# Patient Record
Sex: Female | Born: 1980 | Race: Black or African American | Hispanic: No | Smoking: Former smoker
Health system: Southern US, Community
[De-identification: ages and names within clinical notes are randomized; demographics above are authoritative.]

## PROBLEM LIST (undated history)

## (undated) ENCOUNTER — Inpatient Hospital Stay (HOSPITAL_COMMUNITY): Payer: Self-pay

## (undated) ENCOUNTER — Inpatient Hospital Stay (HOSPITAL_COMMUNITY): Payer: Medicaid - Out of State | Admitting: Obstetrics and Gynecology

## (undated) DIAGNOSIS — I10 Essential (primary) hypertension: Secondary | ICD-10-CM

## (undated) DIAGNOSIS — D219 Benign neoplasm of connective and other soft tissue, unspecified: Secondary | ICD-10-CM

## (undated) DIAGNOSIS — D649 Anemia, unspecified: Secondary | ICD-10-CM

## (undated) DIAGNOSIS — N809 Endometriosis, unspecified: Secondary | ICD-10-CM

## (undated) HISTORY — PX: OTHER SURGICAL HISTORY: SHX169

---
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
## 1999-01-09 ENCOUNTER — Encounter (HOSPITAL_COMMUNITY): Admission: RE | Admit: 1999-01-09 | Discharge: 1999-02-15 | Payer: Self-pay | Admitting: *Deleted

## 1999-01-25 ENCOUNTER — Inpatient Hospital Stay (HOSPITAL_COMMUNITY): Admission: AD | Admit: 1999-01-25 | Discharge: 1999-01-25 | Payer: Self-pay | Admitting: *Deleted

## 1999-02-04 ENCOUNTER — Inpatient Hospital Stay (HOSPITAL_COMMUNITY): Admission: AD | Admit: 1999-02-04 | Discharge: 1999-02-04 | Payer: Self-pay | Admitting: Obstetrics

## 1999-02-13 ENCOUNTER — Inpatient Hospital Stay (HOSPITAL_COMMUNITY): Admission: AD | Admit: 1999-02-13 | Discharge: 1999-02-17 | Payer: Self-pay | Admitting: Obstetrics & Gynecology

## 1999-02-13 ENCOUNTER — Encounter (INDEPENDENT_AMBULATORY_CARE_PROVIDER_SITE_OTHER): Payer: Self-pay

## 1999-02-21 ENCOUNTER — Inpatient Hospital Stay (HOSPITAL_COMMUNITY): Admission: AD | Admit: 1999-02-21 | Discharge: 1999-02-21 | Payer: Self-pay | Admitting: Obstetrics & Gynecology

## 2000-02-06 ENCOUNTER — Inpatient Hospital Stay (HOSPITAL_COMMUNITY): Admission: EM | Admit: 2000-02-06 | Discharge: 2000-02-07 | Payer: Self-pay | Admitting: *Deleted

## 2000-05-04 ENCOUNTER — Inpatient Hospital Stay (HOSPITAL_COMMUNITY): Admission: AD | Admit: 2000-05-04 | Discharge: 2000-05-04 | Payer: Self-pay | Admitting: *Deleted

## 2000-08-22 ENCOUNTER — Inpatient Hospital Stay (HOSPITAL_COMMUNITY): Admission: AD | Admit: 2000-08-22 | Discharge: 2000-08-22 | Payer: Self-pay | Admitting: Obstetrics

## 2000-08-22 ENCOUNTER — Encounter: Payer: Self-pay | Admitting: Obstetrics

## 2000-11-19 ENCOUNTER — Inpatient Hospital Stay (HOSPITAL_COMMUNITY): Admission: AD | Admit: 2000-11-19 | Discharge: 2000-11-19 | Payer: Self-pay | Admitting: Obstetrics & Gynecology

## 2000-11-27 ENCOUNTER — Encounter (HOSPITAL_COMMUNITY): Admission: RE | Admit: 2000-11-27 | Discharge: 2000-12-21 | Payer: Self-pay | Admitting: *Deleted

## 2000-12-18 ENCOUNTER — Encounter: Payer: Self-pay | Admitting: *Deleted

## 2000-12-19 ENCOUNTER — Encounter (INDEPENDENT_AMBULATORY_CARE_PROVIDER_SITE_OTHER): Payer: Self-pay

## 2000-12-19 ENCOUNTER — Inpatient Hospital Stay (HOSPITAL_COMMUNITY): Admission: AD | Admit: 2000-12-19 | Discharge: 2000-12-24 | Payer: Self-pay | Admitting: Obstetrics

## 2000-12-20 ENCOUNTER — Encounter: Payer: Self-pay | Admitting: Obstetrics

## 2000-12-26 ENCOUNTER — Inpatient Hospital Stay (HOSPITAL_COMMUNITY): Admission: AD | Admit: 2000-12-26 | Discharge: 2000-12-26 | Payer: Self-pay | Admitting: Obstetrics

## 2001-07-11 ENCOUNTER — Inpatient Hospital Stay (HOSPITAL_COMMUNITY): Admission: AD | Admit: 2001-07-11 | Discharge: 2001-07-11 | Payer: Self-pay | Admitting: Obstetrics & Gynecology

## 2001-08-12 ENCOUNTER — Encounter: Payer: Self-pay | Admitting: Emergency Medicine

## 2001-08-12 ENCOUNTER — Emergency Department (HOSPITAL_COMMUNITY): Admission: EM | Admit: 2001-08-12 | Discharge: 2001-08-12 | Payer: Self-pay | Admitting: Emergency Medicine

## 2001-12-16 ENCOUNTER — Inpatient Hospital Stay (HOSPITAL_COMMUNITY): Admission: AD | Admit: 2001-12-16 | Discharge: 2001-12-16 | Payer: Self-pay | Admitting: *Deleted

## 2001-12-18 ENCOUNTER — Emergency Department (HOSPITAL_COMMUNITY): Admission: EM | Admit: 2001-12-18 | Discharge: 2001-12-18 | Payer: Self-pay | Admitting: Emergency Medicine

## 2001-12-18 ENCOUNTER — Encounter: Payer: Self-pay | Admitting: Emergency Medicine

## 2002-05-07 ENCOUNTER — Inpatient Hospital Stay (HOSPITAL_COMMUNITY): Admission: AD | Admit: 2002-05-07 | Discharge: 2002-05-07 | Payer: Self-pay | Admitting: Obstetrics and Gynecology

## 2002-08-20 ENCOUNTER — Inpatient Hospital Stay (HOSPITAL_COMMUNITY): Admission: AD | Admit: 2002-08-20 | Discharge: 2002-08-20 | Payer: Self-pay | Admitting: Obstetrics and Gynecology

## 2002-08-20 ENCOUNTER — Encounter: Payer: Self-pay | Admitting: Obstetrics and Gynecology

## 2003-03-16 ENCOUNTER — Inpatient Hospital Stay (HOSPITAL_COMMUNITY): Admission: AD | Admit: 2003-03-16 | Discharge: 2003-03-16 | Payer: Self-pay | Admitting: Obstetrics & Gynecology

## 2003-03-16 ENCOUNTER — Encounter: Payer: Self-pay | Admitting: Obstetrics & Gynecology

## 2003-05-06 ENCOUNTER — Encounter: Payer: Self-pay | Admitting: Obstetrics and Gynecology

## 2003-05-06 ENCOUNTER — Inpatient Hospital Stay (HOSPITAL_COMMUNITY): Admission: AD | Admit: 2003-05-06 | Discharge: 2003-05-06 | Payer: Self-pay | Admitting: Obstetrics and Gynecology

## 2003-07-14 ENCOUNTER — Encounter (INDEPENDENT_AMBULATORY_CARE_PROVIDER_SITE_OTHER): Payer: Self-pay

## 2003-07-14 ENCOUNTER — Inpatient Hospital Stay (HOSPITAL_COMMUNITY): Admission: AD | Admit: 2003-07-14 | Discharge: 2003-07-17 | Payer: Self-pay | Admitting: Obstetrics

## 2003-07-26 ENCOUNTER — Inpatient Hospital Stay (HOSPITAL_COMMUNITY): Admission: AD | Admit: 2003-07-26 | Discharge: 2003-07-26 | Payer: Self-pay | Admitting: Obstetrics

## 2004-02-16 ENCOUNTER — Inpatient Hospital Stay (HOSPITAL_COMMUNITY): Admission: AD | Admit: 2004-02-16 | Discharge: 2004-02-16 | Payer: Self-pay | Admitting: Obstetrics

## 2005-07-25 ENCOUNTER — Emergency Department (HOSPITAL_COMMUNITY): Admission: EM | Admit: 2005-07-25 | Discharge: 2005-07-25 | Payer: Self-pay | Admitting: Emergency Medicine

## 2005-07-27 ENCOUNTER — Emergency Department (HOSPITAL_COMMUNITY): Admission: EM | Admit: 2005-07-27 | Discharge: 2005-07-27 | Payer: Self-pay | Admitting: Emergency Medicine

## 2005-09-22 ENCOUNTER — Inpatient Hospital Stay (HOSPITAL_COMMUNITY): Admission: AD | Admit: 2005-09-22 | Discharge: 2005-09-22 | Payer: Self-pay | Admitting: Obstetrics and Gynecology

## 2006-03-07 ENCOUNTER — Inpatient Hospital Stay (HOSPITAL_COMMUNITY): Admission: AD | Admit: 2006-03-07 | Discharge: 2006-03-07 | Payer: Self-pay | Admitting: Obstetrics and Gynecology

## 2006-03-25 ENCOUNTER — Emergency Department (HOSPITAL_COMMUNITY): Admission: EM | Admit: 2006-03-25 | Discharge: 2006-03-25 | Payer: Self-pay | Admitting: Emergency Medicine

## 2006-04-09 ENCOUNTER — Emergency Department (HOSPITAL_COMMUNITY): Admission: EM | Admit: 2006-04-09 | Discharge: 2006-04-09 | Payer: Self-pay | Admitting: Emergency Medicine

## 2006-06-29 ENCOUNTER — Emergency Department (HOSPITAL_COMMUNITY): Admission: EM | Admit: 2006-06-29 | Discharge: 2006-06-29 | Payer: Self-pay | Admitting: Emergency Medicine

## 2006-12-21 ENCOUNTER — Inpatient Hospital Stay (HOSPITAL_COMMUNITY): Admission: AD | Admit: 2006-12-21 | Discharge: 2006-12-21 | Payer: Self-pay | Admitting: Gynecology

## 2007-04-17 ENCOUNTER — Emergency Department (HOSPITAL_COMMUNITY): Admission: EM | Admit: 2007-04-17 | Discharge: 2007-04-17 | Payer: Self-pay | Admitting: Emergency Medicine

## 2007-05-29 ENCOUNTER — Emergency Department (HOSPITAL_COMMUNITY): Admission: EM | Admit: 2007-05-29 | Discharge: 2007-05-29 | Payer: Self-pay | Admitting: Emergency Medicine

## 2007-06-20 ENCOUNTER — Ambulatory Visit: Payer: Self-pay | Admitting: Obstetrics & Gynecology

## 2007-06-20 ENCOUNTER — Encounter: Payer: Self-pay | Admitting: Obstetrics & Gynecology

## 2007-06-20 ENCOUNTER — Encounter: Payer: Self-pay | Admitting: Obstetrics and Gynecology

## 2007-06-30 ENCOUNTER — Emergency Department (HOSPITAL_COMMUNITY): Admission: EM | Admit: 2007-06-30 | Discharge: 2007-06-30 | Payer: Self-pay | Admitting: *Deleted

## 2007-07-02 ENCOUNTER — Emergency Department (HOSPITAL_COMMUNITY): Admission: EM | Admit: 2007-07-02 | Discharge: 2007-07-02 | Payer: Self-pay | Admitting: Family Medicine

## 2007-08-07 ENCOUNTER — Encounter (INDEPENDENT_AMBULATORY_CARE_PROVIDER_SITE_OTHER): Payer: Self-pay | Admitting: General Surgery

## 2007-08-07 ENCOUNTER — Ambulatory Visit (HOSPITAL_COMMUNITY): Admission: RE | Admit: 2007-08-07 | Discharge: 2007-08-07 | Payer: Self-pay | Admitting: General Surgery

## 2007-11-04 ENCOUNTER — Inpatient Hospital Stay (HOSPITAL_COMMUNITY): Admission: AD | Admit: 2007-11-04 | Discharge: 2007-11-04 | Payer: Self-pay | Admitting: Gynecology

## 2008-01-12 ENCOUNTER — Inpatient Hospital Stay (HOSPITAL_COMMUNITY): Admission: AD | Admit: 2008-01-12 | Discharge: 2008-01-12 | Payer: Self-pay | Admitting: Obstetrics & Gynecology

## 2008-02-20 ENCOUNTER — Inpatient Hospital Stay (HOSPITAL_COMMUNITY): Admission: AD | Admit: 2008-02-20 | Discharge: 2008-02-20 | Payer: Self-pay | Admitting: Obstetrics & Gynecology

## 2008-03-18 ENCOUNTER — Emergency Department (HOSPITAL_COMMUNITY): Admission: EM | Admit: 2008-03-18 | Discharge: 2008-03-18 | Payer: Self-pay | Admitting: Emergency Medicine

## 2008-07-03 ENCOUNTER — Emergency Department (HOSPITAL_COMMUNITY): Admission: EM | Admit: 2008-07-03 | Discharge: 2008-07-03 | Payer: Self-pay | Admitting: Emergency Medicine

## 2009-01-05 ENCOUNTER — Emergency Department (HOSPITAL_COMMUNITY): Admission: EM | Admit: 2009-01-05 | Discharge: 2009-01-05 | Payer: Self-pay | Admitting: Emergency Medicine

## 2009-03-16 ENCOUNTER — Emergency Department (HOSPITAL_COMMUNITY): Admission: EM | Admit: 2009-03-16 | Discharge: 2009-03-16 | Payer: Self-pay | Admitting: Emergency Medicine

## 2009-07-29 ENCOUNTER — Emergency Department (HOSPITAL_COMMUNITY): Admission: EM | Admit: 2009-07-29 | Discharge: 2009-07-29 | Payer: Self-pay | Admitting: Emergency Medicine

## 2009-07-31 ENCOUNTER — Emergency Department (HOSPITAL_COMMUNITY): Admission: EM | Admit: 2009-07-31 | Discharge: 2009-07-31 | Payer: Self-pay | Admitting: Emergency Medicine

## 2009-09-29 ENCOUNTER — Emergency Department (HOSPITAL_COMMUNITY): Admission: EM | Admit: 2009-09-29 | Discharge: 2009-09-29 | Payer: Self-pay | Admitting: Emergency Medicine

## 2009-09-30 ENCOUNTER — Emergency Department (HOSPITAL_COMMUNITY): Admission: EM | Admit: 2009-09-30 | Discharge: 2009-09-30 | Payer: Self-pay | Admitting: Emergency Medicine

## 2010-05-06 ENCOUNTER — Inpatient Hospital Stay (HOSPITAL_COMMUNITY): Admission: AD | Admit: 2010-05-06 | Discharge: 2010-05-06 | Payer: Self-pay | Admitting: Obstetrics & Gynecology

## 2010-05-06 ENCOUNTER — Ambulatory Visit: Payer: Self-pay | Admitting: Family

## 2010-08-21 ENCOUNTER — Encounter: Payer: Self-pay | Admitting: Obstetrics

## 2010-10-12 LAB — POCT PREGNANCY, URINE: Preg Test, Ur: NEGATIVE

## 2010-10-12 LAB — URINALYSIS, ROUTINE W REFLEX MICROSCOPIC
Bilirubin Urine: NEGATIVE
Glucose, UA: NEGATIVE mg/dL
Protein, ur: NEGATIVE mg/dL

## 2010-10-12 LAB — URINE MICROSCOPIC-ADD ON

## 2010-10-15 LAB — POCT PREGNANCY, URINE: Preg Test, Ur: NEGATIVE

## 2010-12-13 NOTE — Group Therapy Note (Signed)
NAME:  Wendy Floyd, Wendy Floyd NO.:  [id number]   MEDICAL RECORD NO.:  [id number]          PATIENT TYPE:  WOC   LOCATION:  [facility]                   FACILITY:  [facility]   PHYSICIAN:  Elsie Lincoln, MD      DATE OF BIRTH:  [birth year]   DATE OF SERVICE:  06/20/2007                                  CLINIC NOTE   The patient is a 30 year old G4, P4 female with a history of four C-  sections, who presents for Pap smear, evaluation of urinary frequency  and pain in her previous four C-section scars.  The patient is sexually  active and not using birth control pills or any other type birth  control.  She wants to start birth control pills.  We did some  counseling and will start her on Ovcon 35 or the generic equivalent.  The patient has been having this pain in her right lower quadrant at her  scar.  There was an ultrasound done in May 2008 which showed a 1.7 x 1.5  x 1.8 cm heterogeneous soft tissue mass in the right anterior abdominal  wall with no evidence of hernia.  It could be endometriosis scar tissue  or neoplasm.  Again, it is not very mobile, but does not feel like  hernia today.  I think we should excise this and I will schedule her for  this.  She understands that they could recur and her pain may not go  away.  As far as her urination, she feels that she has to void and when  she does void, only a little bit comes out.  She has had a urinalysis  done in the past and never has had any urinary tract infection.  She may  have detrusor instability or may not be completely emptying her bladder.  We will do a post void residual today and do a urine culture today.  If  both of these are negative, we will send her to urology for urodynamics.  Note, the patient seems slightly angry and is using her portable hand-  held device on the Internet while I am conducting the interview and  physical exam.  We have never met before, so I do not understand why she  is so angry, however,  she most likely has had a hard life and I am just  attributing it to this.   PAST MEDICAL HISTORY:  1. She has had kidney problems in the past, but this is mostly      bladder.  2. She has had pneumonia in the past.  3. She has had high blood pressure, however, her blood pressure is      130/80 today.   PAST SURGICAL HISTORY:  C-section x4.   FAMILY HISTORY:  Mother has diabetes and high blood pressure.   SOCIAL HISTORY:  She smokes, drinks, does not do drugs.  Of note, she  has been incarcerated in the past and she will not tell me why.   REVIEW OF SYSTEMS:  Systemic review is grossly positive and she is in  great need of primary care.  She is positive for  swelling in legs,  muscle aches, fever, night sweats, fatigue, weight loss, weight gain,  frequent headaches, dizzy spells, problems with ears and hearing,  problems with nose and smelling, problems with vision, coughing up  phlegm or blood, problems with shortness of breath, chest pain, nausea,  vomiting, problems urination, loss of urine with coughing or sneezing,  vaginal odor and pain with intercourse.  Right now we will address the  pain in her abdomen and the urinary problems.  I have suggested she make  appointment at [facility] to address her other systemic problems.  She  does not appear in any distress right now.   PHYSICAL EXAMINATION:  VITAL SIGNS:  Temperature 97.1, pulse 74, blood  pressure 130/87, weight 208, height 5 feet 9.  GENERAL:  Well-nourished, well-developed, no apparent distress.  NEUROLOGIC:  Alert and oriented x3 with good hand-eye coordination as  she is using her hand-held device to search the Internet during her  entire exam.  HEENT:  Normocephalic, atraumatic.  LUNGS:  Good inspiratory effort.  ABDOMEN:  Soft, nontender, nondistended.  There is a hardness in the C-  section scar.  There is no evidence of hernia or organomegaly.  GENITALIA:  Tanner 5.  Vagina pink, normal rugae.  Uterus  anterior,  mobile, nontender.  Adnexa no masses, nontender.  No hemorrhoids.  EXTREMITIES:  Nontender.   ASSESSMENT/PLAN:  A 30 year old female for gynecologic exam.  1. Pap smear cultures.  2. The patient wants human immunodeficiency virus,  hepatitis B,      hepatitis C and hepatitis.  3. Post void residual.  4. Urine culture.  5. Schedule for surgery.  We will excise this area of fascia.  6. Will refer to urology if all above is negative.           ______________________________  Elsie Lincoln, MD     KL/MEDQ  D:  06/20/2007  T:  06/21/2007  Job:  [id number]

## 2010-12-13 NOTE — Op Note (Signed)
NAME:  Wendy Floyd, Wendy Floyd              ACCOUNT NO.:  [id number]   MEDICAL RECORD NO.:  [id number]          PATIENT TYPE:  AMB   LOCATION:  [facility]                          FACILITY:  [facility]   PHYSICIAN:  Sharlet Salina T. Hoxworth, M.D.DATE OF BIRTH:  1980-08-07   DATE OF PROCEDURE:  08/07/2007  DATE OF DISCHARGE:                               OPERATIVE REPORT   PREOPERATIVE DIAGNOSIS:  Mass, abdominal wall.   POSTOPERATIVE DIAGNOSIS:  Mass, abdominal wall.   SURGICAL PROCEDURES:  Excision of mass, abdominal wall.   SURGEON:  Lorne Skeens. Hoxworth, M.D.   ANESTHESIA:  General.   BRIEF HISTORY:  Wendy Floyd is a 30 year old female with a history of  four C-sections in the past.  The most recent was several years ago.  She presents with a fairly long history of a gradually increasing,  uncomfortable palpable lump at the lateral end of her C-section scar on  the abdominal wall.  Imaging has revealed a 2-cm mass in that area  consistent with endometriosis scar or possible neoplasm.  Due to  enlargement and symptoms, we have recommended excision.  The nature of  the procedure, indications, risks of bleeding, infection and recurrence  were discussed and understood.  The patient is now brought to the  operating room for this procedure.   DESCRIPTION OF OPERATION:  The patient was brought to the operating room  and placed in supine position on the operating table and general  endotracheal anesthesia was induced.  The right lower abdomen was widely  sterilely prepped and draped.  Correct patient and procedure were  verified.  The mass was palpable against the abdominal wall just over  the lateral portion of her previous C-section scar.  A portion of the  scar laterally was elliptically excised and then dissection carried down  through subcutaneous tissue.  There was a fairly discrete, very firm  mass lying against the anterior fascia.  This was completely dissected  away from surrounding soft  tissue with cautery down to its attachment to  the fascia.  It was then excised off the anterior fascia with cautery.  The mass appeared to be grossly completely excised and was lying just  anterior to the fascia and the fascia was intact.  I bisected the mass  in the operating room and this was a very rounded, discrete fibrous mass  with some a dark mottling, possibly consistent with endometriosis.  The  wound was inspected and complete hemostasis assured.  Soft tissues  infiltrated with Marcaine with epinephrine.  The subcu was then closed  with running 3-0 Vicryl and the skin with running subcuticular 4-0  Monocryl and Dermabond.  Sponge, needle and instrument counts correct.  The patient was taken to recovery in good condition.      Lorne Skeens. Hoxworth, M.D.  Electronically Signed     BTH/MEDQ  D:  08/07/2007  T:  08/07/2007  Job:  [id number]

## 2010-12-16 NOTE — Discharge Summary (Signed)
[facility]  Patient:    Wendy Floyd, Wendy Floyd                       MRN: [id number] Adm. Date:  12/19/00 Disc. Date: 12/24/00 Dictator:   Marvis Moeller, C.N.M.                           Discharge Summary  ADMISSION DIAGNOSES:          1. A 40-1/7 week intrauterine pregnancy.                               2. Oligohydramnios.                               3. Pyelonephritis.                               4. Positive group B streptococcus.  DISCHARGE DIAGNOSES:          1. Repeat stat cesarean section, female                                  Apgars 9 and 9, weight 5-14.                               2. Uterine scar dehiscence.                               3. Pyelonephritis.                               4. Substance abuse.  PRESENT HISTORY:              This is a 30 year old, G3, P2-0-0-2 who presented at 39-6/7 weeks with nausea, vomiting, and backache in the left flank. Her urinalysis was found to be positive for nitrites and LE and subsequently, the urine culture grew out enterococcus which was sensitive to penicillin and Macrodantin. She also was referring a decrease from her baseline in fetal movement. She denied any ruptured membranes, vaginal bleeding. She denied dysuria or hematuria as well.  PREGNANCY COURSE:             The patient presented very late for prenatal care at approximately 37 weeks. She had, however, had an ultrasound done at 23 weeks which dated her with an EDD of May 24. Her LMP was unknown. At the first prenatal visit she was counseled on the risks of uterine rupture being 1% with trial of labor versus risk of repeat C-section. She understood and accepted the risk. She did not want a postpartum tubal ligation. At the initial prenatal visit, her records were reviewed and it was documented that first delivery in 1999 at 42 weeks, she had a primary low transverse cesarean section indicated by fetal heart rate decelerations of  prolonged nature in labor and also meconium. Weight was 8 pounds 9 ounces. In July of 2000, also at 42 weeks, she had a secondary low transverse cesarean section which was done because of incisional pain. The  patient experienced an early active phase of labor. The op note states that the lower uterine segment was "thin but intact" per Dr. Clearance Coots. In consultation with Dr. Clearance Coots, on the day of her first prenatal visit, it was decided that she could have a trial of labor and that we would reconsider this plan if she became significantly postdates. Of note, on her second prenatal visit on Dec 04, 2000, this same plan was again discussed with Dr. Gavin Potters who was aware of her history of two previous C-sections and he agreed with the above plan. Of note, her urine drug screen had been positive for marijuana which she denied using. Also of note, her beta strep was positive on November 19, 2000. Prenatal labs were otherwise unremarkable.  PERTINENT OBSTETRIC HISTORY:  As above.  GYNECOLOGIC HISTORY:          Significant for positive gonorrhea and positive Chlamydia in the past.  FAMILY HISTORY:               Noncontributory.  SOCIAL HISTORY:               Patient was denying any substance abuse.  ADMISSION PHYSICAL EXAMINATION:                  Temperature was 97.4, pulse 91, blood pressure 126/68. Lungs clear. Heart RRR. Abdomen nontender and gravid. She did have mild right CVA tenderness. Her cervical exam was fingertip, soft, posterior, 75% with a vertex at 0 station. Fetal heart rate baseline was initially high in the 160s with average variability and occasional variable deceleration. However, on prolonged monitoring, the fetal heart rate baseline normalized. She was having irregular mild contractions.  HOSPITAL COURSE:              The patient was admitted and started on IV Cefotan and continuous monitoring. Ultrasound of Dec 20, 2000 found that her AFI was 5.5. She was observed on the  Cefotetan and her CVA tenderness resolved. On May 24 in consultation with Dr. Gavin Potters, the plan was made to induce labor using Cervidil. The Cervidil was also repeated on Dec 22, 2000. On Dec 23, 2000 at 8 oclock there was noted to be bright red bleeding per vagina and the cervix at that time was 2/90/ -2. Dr. Gavin Potters was consulted and decision was made to proceed with repeat cesarean section. The op note report says that there was probable uterine scar dehiscence on the left. EBL was 800 cc. Anesthesia was spinal.  Her postoperative course was unremarkable. She had a social service visit due to having had a positive drug screen for marijuana. She had no further CVA tenderness postoperatively. CPS was involved with the case before she was discharged.  DISCHARGE MEDICATIONS:        1. Ibuprofen 600 mg p.o. q.6h.                               2. Macrobid one b.i.d. for seven days.                               3. Micronor to start second week postpartum.   ACTIVITY:                     Routine activity restrictions for being postoperative.  DISCHARGE FOLLOWUP:  She was to follow up on Dec 26, 2000 to get her staples removed.  DISCHARGE LABORATORIES:       Her urine drug screen during hospitalization was positive for cannabis and her discharge hemoglobin was 10.4, hematocrit 31.3.  DISPOSITION:                  She was discharged home in stable condition. DD:  02/01/01 TD:  02/01/01 Job: [id number]

## 2010-12-16 NOTE — Op Note (Signed)
[facility]  Patient:    Wendy Floyd, Wendy Floyd                       MRN: [id number] Proc. Date: 12/22/00 Adm. Date:  [id number] Attending:  Tammi Sou                           Operative Report  PREOPERATIVE DIAGNOSIS:         Maternal hemorrhage secondary to uterine scar dehiscence versus abruption.  POSTOPERATIVE DIAGNOSIS:        Maternal hemorrhage secondary to uterine scar dehiscence versus abruption.  Probable uterine scar dehiscence on the left.  OPERATION:                      Low transverse cesarean repeat.  SURGEON:                        Conni Elliot, M.D.  ANESTHESIA:                     Spinal.  OPERATIVE FINDINGS:             Female infant, Apgars 9 and 9, cord pH of ______________________.  DESCRIPTION OF PROCEDURE:       The patient was brought to the operating room urgently and was prepped and draped in the sterile fashion.  The patient was given oxygen.  The abdomen was entered through a low transverse Pfannenstiel incision.   Incision was made through subcutaneous fascia.  Rectus muscles were separated in the midline.  Peritoneal cavity entered.  Bladder flap created and a low uterine incision was made.  Prior to the uterine incision, there was an apparent uterine scar rupture.  However, after delivery of the fetus, the cord was clamped and cut and the neonatologist _____ with manual removal.  The uterine scar on the left was noted to dissect well to the left in the area of the branch of the uterine artery although no definite dehiscence was identified.  The uterus, bladder flap, anterior peritoneum, fascia and skin were closed in the usual fashion.  The estimated blood loss was less than 800 cc.  Needle and sponge counts counts were correct. DD:  12/22/00 TD:  12/23/00 Job: [id number]

## 2010-12-16 NOTE — Op Note (Signed)
NAMERAISA, DITTO                          ACCOUNT NO.:  [id number]   MEDICAL RECORD NO.:  [id number]                   PATIENT TYPE:  INP   LOCATION:  9167                                 FACILITY:  [facility]   PHYSICIAN:  Roseanna Rainbow, M.D.         DATE OF BIRTH:  14-Oct-1980   DATE OF PROCEDURE:  07/14/2003  DATE OF DISCHARGE:                                 OPERATIVE REPORT   PREOPERATIVE DIAGNOSES:  1. Intrauterine pregnancy at term.  2. Early labor.  3. History of three previous cesarean deliveries.   POSTOPERATIVE DIAGNOSES:  1. Intrauterine pregnancy at term.  2. Early labor.  3. History of three previous cesarean deliveries.   PROCEDURES:  1. Repeat cesarean delivery.  2. Lysis of adhesions.   SURGEON:  Roseanna Rainbow, M.D.   ANESTHESIA:  Spinal.   ESTIMATED BLOOD LOSS:  800 mL.   COMPLICATIONS:  Small right broad ligament hematoma.   FINDINGS:  A liveborn female, vertex, meconium-stained fluid.  Apgars were 8  and 10 at one and five minutes, respectively.   DESCRIPTION OF PROCEDURE:  The patient was taken to the operating room,  where a spinal anesthetic was administered without difficulty.  She was then  placed in the dorsal supine position with a leftward tilt and prepped and  draped in the usual sterile fashion.  The abdomen was then opened through  the previous Pfannenstiel incision with the scalpel.  The incision was  carried down to the underlying fascia.  The fascia was nicked in the  midline.  The fascial incision was then extended bilaterally with curved  Mayo scissors.  The superior aspect of the fascial incision was then tented  up with Kocher clamps and the underlying rectus muscles dissected off.  The  inferior aspect of the fascial incision was then manipulated in a similar  fashion.  The rectus muscles were separated in the midline.  The parietal  peritoneum was tented up and entered sharply with Metzenbaum scissors.  The  incision  was extended superiorly and inferiorly with good visualization of  the bladder.  The bladder blade was then placed.  The vesicouterine  peritoneum was tented up and entered sharply.  This incision was then  extended bilaterally and the bladder flap created sharply.  The bladder  blade was then replaced.  The lower uterine segment was then incised in a  transverse fashion with the scalpel.  This incision was then extended  bilaterally with bandage scissors.  The infant's head was then delivered  atraumatically.  The infant was suctioned with the bulb suction.  The cord  was clamped and cut.  The infant was then handed off to the awaiting  neonatologist.  The placenta was then removed, the uterus evacuated of  amniotic fluid, grasping debris with a moistened laparotomy sponge.  The  uterine incision was reapproximated with 0 Monocryl in a running  interlocking fashion.  Adequate hemostasis was noted.  At this point a small  right broad ligament hematoma was noted to be extending from the inferior  lateral margin of the incision.  At this point the uterus was exteriorized  and an O'Leary suture was placed above the hematoma at the line of the  incision.  The hematoma was inspected at this point, found not to be  expanding.  The uterus was returned to the abdomen.  The paracolic gutters  were copiously irrigated.  Again the hematoma was inspected with the uterus  replaced in the abdomen and was felt not to be expanding.  At this point  some filmy adhesions involving the parietal peritoneum and omentum were  divided with cautery.  The parietal peritoneum was reapproximated with 2-0  Vicryl.  The fascia was reapproximated with 0 PDS.  The skin was closed with  staples.  At the close of the procedure the instrument and pack counts were  said to be correct x2.  Cefazolin 1 g was given at cord clamp.  The patient  was taken to the PACU awake and in stable condition.                                                Roseanna Rainbow, M.D.    Judee Clara  D:  07/14/2003  T:  07/14/2003  Job:  [id number]

## 2010-12-16 NOTE — Discharge Summary (Signed)
Wendy Floyd, Wendy Floyd                          ACCOUNT NO.:  [id number]   MEDICAL RECORD NO.:  [id number]                   PATIENT TYPE:  INP   LOCATION:  9115                                 [facility]ACILITY:  [facility]   PHYSICIAN:  Charles A. Clearance Coots, M.D.             DATE OF BIRTH:  1981/02/02   DATE OF ADMISSION:  07/14/2003  DATE OF DISCHARGE:                                 DISCHARGE SUMMARY   ADMITTING DIAGNOSES:  1. Intrauterine pregnancy at term.  2. Early labor.  3. History of three previous cesarean sections; desires repeat cesarean     section.   DISCHARGE DIAGNOSES:  1. Intrauterine pregnancy at term.  2. Early labor.  3. History of three previous cesarean sections; desires repeat cesarean     section.  4. Status post repeat low transverse cesarean section on July 14, 2003     with delivery of viable female infant at 31, Apgars of 8 at one minute     and 10 at five minutes, weight of 3625 grams, length of 53.5 cm.  Mother     and infant discharged home in good condition.   REASON FOR ADMISSION:  A 30 year old black female para 3, estimated date of  confinement of July 14, 2003 with an intrauterine pregnancy at 109 and  five-sevenths weeks complaining of uterine contractions.  Denies rupture of  membranes.   PAST MEDICAL HISTORY:  Significant for:  1. Three previous cesarean sections.  2. Late onset of prenatal care with this pregnancy.  3. History of substance abuse during pregnancy.  4. History of antisocial behavior and history of incarceration.   PAST SURGICAL HISTORY:  Three cesarean section deliveries.   ILLNESSES:  Migraine headaches and history of pyelonephritis.   MEDICATIONS:  Prenatal vitamins.   ALLERGIES:  No known drug allergies.   SOCIAL HISTORY:  Single.  Negative tobacco use but positive substance abuse  and positive alcohol.   PHYSICAL EXAMINATION:  GENERAL:  Well-nourished, well-developed black female  in no acute distress.  VITAL SIGNS:   Stable; she is afebrile.  Fetal heart tones were reassuring.  External fetal monitor also revealed uterine contractions every five  minutes.  The patient was uncomfortable with these contractions.  HEENT:  Normal.  LUNGS:  Clear to auscultation bilaterally.  HEART:  Regular rate and rhythm.  ABDOMEN:  Gravid, soft, nontender.  PELVIC:  Cervix 2 cm dilated, 80% effaced.   LABORATORY VALUES:  Hemoglobin 9.9, hematocrit 29.3.  ABO/Rh is O positive.   HOSPITAL COURSE:  The patient underwent a repeat low transverse cesarean  section delivery on July 14, 2003.  There were no intraoperative  complications except for small right broad ligament hematoma.  Postoperative  and postpartum course was uncomplicated and the patient was discharged home  on postoperative day #3 in good condition.   DISCHARGE LABORATORY VALUES:  Hemoglobin 9.4; hematocrit 27.2; white blood  cell count 9200; platelets 185,000.  DISCHARGE DISPOSITION:  1. Medications:  Tylox and ibuprofen were prescribed for pain.  The patient     is to continue prenatal vitamins.  2. Routine written discharge instructions per booklet were given for     obstetrical discharge with cesarean section.  3. The patient is to follow up in the office in six weeks.                                               Charles A. Clearance Coots, M.D.    CAH/MEDQ  D:  07/17/2003  T:  07/17/2003  Job:  [id number]

## 2011-03-09 ENCOUNTER — Emergency Department (HOSPITAL_COMMUNITY)
Admission: EM | Admit: 2011-03-09 | Discharge: 2011-03-09 | Disposition: A | Discharge status: Home / Self Care | Payer: Self-pay | Attending: Emergency Medicine | Admitting: Emergency Medicine

## 2011-03-09 DIAGNOSIS — K089 Disorder of teeth and supporting structures, unspecified: Secondary | ICD-10-CM | POA: Insufficient documentation

## 2011-03-09 DIAGNOSIS — N390 Urinary tract infection, site not specified: Secondary | ICD-10-CM | POA: Insufficient documentation

## 2011-03-09 DIAGNOSIS — N898 Other specified noninflammatory disorders of vagina: Secondary | ICD-10-CM | POA: Insufficient documentation

## 2011-03-09 DIAGNOSIS — K047 Periapical abscess without sinus: Secondary | ICD-10-CM | POA: Insufficient documentation

## 2011-03-09 LAB — COMPREHENSIVE METABOLIC PANEL
ALT: 25 U/L (ref 0–35)
AST: 24 U/L (ref 0–37)
Calcium: 9.5 mg/dL (ref 8.4–10.5)
Sodium: 136 mEq/L (ref 135–145)
Total Protein: 7.3 g/dL (ref 6.0–8.3)

## 2011-03-09 LAB — CBC
Hemoglobin: 11.6 g/dL — ABNORMAL LOW (ref 12.0–15.0)
RBC: 4.54 MIL/uL (ref 3.87–5.11)
WBC: 4.9 10*3/uL (ref 4.0–10.5)

## 2011-03-09 LAB — URINALYSIS, ROUTINE W REFLEX MICROSCOPIC
Glucose, UA: NEGATIVE mg/dL
Ketones, ur: NEGATIVE mg/dL
Protein, ur: NEGATIVE mg/dL
Urobilinogen, UA: 0.2 mg/dL (ref 0.0–1.0)

## 2011-03-09 LAB — WET PREP, GENITAL
Trich, Wet Prep: NONE SEEN
Yeast Wet Prep HPF POC: NONE SEEN

## 2011-03-09 LAB — DIFFERENTIAL
Basophils Absolute: 0 10*3/uL (ref 0.0–0.1)
Lymphocytes Relative: 23 % (ref 12–46)
Neutro Abs: 3.4 10*3/uL (ref 1.7–7.7)

## 2011-03-09 LAB — RPR: RPR Ser Ql: NONREACTIVE

## 2011-03-09 LAB — URINE MICROSCOPIC-ADD ON

## 2011-03-10 LAB — URINE CULTURE: Culture  Setup Time: 201208091408

## 2011-03-10 LAB — GC/CHLAMYDIA PROBE AMP, GENITAL
Chlamydia, DNA Probe: NEGATIVE
GC Probe Amp, Genital: NEGATIVE

## 2011-04-11 ENCOUNTER — Inpatient Hospital Stay (HOSPITAL_COMMUNITY): Payer: Self-pay

## 2011-04-11 ENCOUNTER — Encounter (HOSPITAL_COMMUNITY): Payer: Self-pay

## 2011-04-11 ENCOUNTER — Inpatient Hospital Stay (HOSPITAL_COMMUNITY)
Admission: AD | Admit: 2011-04-11 | Discharge: 2011-04-11 | Disposition: A | Discharge status: Home / Self Care | Payer: Self-pay | Source: Ambulatory Visit | Attending: Obstetrics and Gynecology | Admitting: Obstetrics and Gynecology

## 2011-04-11 DIAGNOSIS — R102 Pelvic and perineal pain: Secondary | ICD-10-CM

## 2011-04-11 DIAGNOSIS — N949 Unspecified condition associated with female genital organs and menstrual cycle: Secondary | ICD-10-CM | POA: Insufficient documentation

## 2011-04-11 LAB — URINALYSIS, ROUTINE W REFLEX MICROSCOPIC
Glucose, UA: NEGATIVE mg/dL
Ketones, ur: NEGATIVE mg/dL
Leukocytes, UA: NEGATIVE
Specific Gravity, Urine: 1.01 (ref 1.005–1.030)
pH: 7 (ref 5.0–8.0)

## 2011-04-11 LAB — CBC
HCT: 37.4 % (ref 36.0–46.0)
Hemoglobin: 11.9 g/dL — ABNORMAL LOW (ref 12.0–15.0)
MCH: 25.8 pg — ABNORMAL LOW (ref 26.0–34.0)
MCHC: 31.8 g/dL (ref 30.0–36.0)
MCV: 81.1 fL (ref 78.0–100.0)
RDW: 15.6 % — ABNORMAL HIGH (ref 11.5–15.5)

## 2011-04-11 LAB — WET PREP, GENITAL
Trich, Wet Prep: NONE SEEN
Yeast Wet Prep HPF POC: NONE SEEN

## 2011-04-11 LAB — URINE MICROSCOPIC-ADD ON

## 2011-04-11 MED ORDER — NAPROXEN SODIUM 550 MG PO TABS: 550.0000 mg | ORAL_TABLET | Freq: Two times a day (BID) | ORAL | Status: DC

## 2011-04-11 NOTE — Plan of Care (Signed)
Pt is on the phone and will not discontinue the call to come to triage.

## 2011-04-11 NOTE — ED Provider Notes (Signed)
History     Chief Complaint  Patient presents with  . Vaginal Discharge   HPI: Wendy Floyd is a 30 yo African American female who presents with 2 week history of abdominal pain in RLQ.  States that 3-4 months ago she felt a "knot" in RLQ that is intermittently painful in days leading up to menses and throughout menses. LMP 03/05/2011, but does note some lite bleeding toward the end of August.  Reports that menstrual cycle is usual regular.  Has concerns about missed period.  Currently sexually active with her husband, not using any form of contraception or barrier protection. Reports approximately 2 week history of occasional clear/white vaginal discharge.  No associated vaginal pruritis, pain or dyspareunia.    Reports urinary frequency but denies dysuria or urgency. Denies gross hematuria.  Reports 2 week history of non-bloody diarrhea.    OB History    Grav Para Term Preterm Abortions TAB SAB Ect Mult Living   4 4 4  0 0 0 0 0 0 4      Past Medical History  Diagnosis Date  . No pertinent past medical history     Past Surgical History  Procedure Date  . Cesarean section     No family history on file.  History  Substance Use Topics  . Smoking status: Current Everyday Smoker  . Smokeless tobacco: Not on file  . Alcohol Use: Yes    Allergies: No Known Allergies  No prescriptions prior to admission    Review of Systems  Constitutional: Negative.   HENT: Negative.   Eyes: Negative.   Respiratory: Negative.   Cardiovascular: Negative.   Gastrointestinal: Positive for abdominal pain (Reports RLQ pain at site of "knot" during days leading up to menses.) and diarrhea (2 week history of intermitent diarrhea. Described as non-bloody). Negative for constipation, blood in stool and melena.  Genitourinary: Negative for dysuria, urgency, frequency and hematuria.  Musculoskeletal: Negative.   Skin: Negative.   Neurological: Negative.   Endo/Heme/Allergies: Negative.     Psychiatric/Behavioral: Negative.    Physical Exam   Blood pressure 130/91, pulse 71, temperature 98.6 F (37 C), temperature source Oral, resp. rate 16, height 5' 8.5" (1.74 m), weight 198 lb 14.4 oz (90.22 kg), last menstrual period 03/05/2011, SpO2 99.00%. CBC    Component Value Date/Time   WBC 7.9 04/11/2011 1257   RBC 4.61 04/11/2011 1257   HGB 11.9* 04/11/2011 1257   HCT 37.4 04/11/2011 1257   PLT 277 04/11/2011 1257   MCV 81.1 04/11/2011 1257   MCH 25.8* 04/11/2011 1257   MCHC 31.8 04/11/2011 1257   RDW 15.6* 04/11/2011 1257   LYMPHSABS 1.1 03/09/2011 1115   MONOABS 0.3 03/09/2011 1115   EOSABS 0.0 03/09/2011 1115   BASOSABS 0.0 03/09/2011 1115   Urine Pregnancy Test: Negative  Transvaginal/Transabdominal US:The uterus is normal in size and echotexture, measuring  10.0 x 5.5 x 5.5 cm. The endometrial stripe is homogeneous and  within normal limits in width, measuring 11 mm. There are no  uterine fibroids        Physical Exam  Constitutional: She is oriented to person, place, and time. She appears well-developed and well-nourished. She appears distressed.  HENT:  Head: Normocephalic and atraumatic.  Eyes: Conjunctivae and EOM are normal. Pupils are equal, round, and reactive to light. No scleral icterus.  Neck: Normal range of motion.  Cardiovascular: Normal rate, regular rhythm, normal heart sounds and intact distal pulses.  Exam reveals no gallop and no friction rub.  No murmur heard. Respiratory: Effort normal and breath sounds normal. No respiratory distress. She has no wheezes. She has no rales.  GI: Soft. She exhibits mass (3cm mass present upon deep palpation, located 3-4cm medical to ASIS). She exhibits no distension. There is tenderness (Mild tenderness over RLQ.  ). There is no rebound and no guarding.  Genitourinary: Vaginal discharge (Small amount of white discharge throughout vagina on speculum exam.  Cervix is without lesions or bleeding. No cervical source of  discharge) found.  Musculoskeletal: Normal range of motion.  Neurological: She is alert and oriented to person, place, and time. No cranial nerve deficit.  Skin: Skin is warm and dry.  Psychiatric: She has a normal mood and affect. Her behavior is normal. Judgment and thought content normal.    MAU Course  Procedures  MDM CBC: Reveals mild anemia.  No WBC elevation Urine Pregnany: Negative Pelvic Ultrasound: Negative   Assessment and Plan  Wendy Floyd is a 30 yo female who presented to [facility]U with concerns regarding missed period for month of September (LMP 03/05/2011) and complaints of a abdominal mass in RLQ.  UA, CBC and pelvic ultrasound were not suspicious of genitourinary or pelvic origin to pain.  At this time there is no evidence of gyn source for her complaints.   1) Abdominal Pain: Anaprox and patient advised to f/u with primary care provider.  Clinton Gallant. Nicoli Nardozzi III, DrHSc, MPAS, PA-C  04/11/2011, 12:46 PM   [name], PA 04/11/11 1733

## 2011-04-11 NOTE — Progress Notes (Signed)
Pt stats she has a knot in the R groin area that is tender to palpation for about 3 months. Has had a white creamy vaginal discharge and abdominal pain the comes and goes. No pain at this time. Has missed her last period.

## 2011-04-12 LAB — GC/CHLAMYDIA PROBE AMP, GENITAL: Chlamydia, DNA Probe: NEGATIVE

## 2011-04-19 NOTE — ED Provider Notes (Signed)
Agree with above note.  Wendy Floyd 04/19/2011 9:07 AM

## 2011-04-20 LAB — BASIC METABOLIC PANEL
BUN: 5 — ABNORMAL LOW
CO2: 25
Chloride: 107
Glucose, Bld: 105 — ABNORMAL HIGH
Potassium: 3.8
Sodium: 137

## 2011-04-20 LAB — CBC
HCT: 31.5 — ABNORMAL LOW
Hemoglobin: 10 — ABNORMAL LOW
MCHC: 31.7
MCV: 71.7 — ABNORMAL LOW
Platelets: 308
RDW: 17.7 — ABNORMAL HIGH

## 2011-04-25 ENCOUNTER — Inpatient Hospital Stay (HOSPITAL_COMMUNITY)
Admission: AD | Admit: 2011-04-25 | Discharge: 2011-04-25 | Disposition: A | Discharge status: Home / Self Care | Payer: Self-pay | Source: Ambulatory Visit | Attending: Obstetrics & Gynecology | Admitting: Obstetrics & Gynecology

## 2011-04-25 ENCOUNTER — Encounter (HOSPITAL_COMMUNITY): Payer: Self-pay

## 2011-04-25 DIAGNOSIS — N949 Unspecified condition associated with female genital organs and menstrual cycle: Secondary | ICD-10-CM | POA: Insufficient documentation

## 2011-04-25 DIAGNOSIS — N910 Primary amenorrhea: Secondary | ICD-10-CM

## 2011-04-25 LAB — URINE MICROSCOPIC-ADD ON

## 2011-04-25 LAB — POCT PREGNANCY, URINE
Operator id: 183831
Preg Test, Ur: NEGATIVE
Preg Test, Ur: NEGATIVE

## 2011-04-25 LAB — URINALYSIS, ROUTINE W REFLEX MICROSCOPIC
Bilirubin Urine: NEGATIVE
Bilirubin Urine: NEGATIVE
Glucose, UA: NEGATIVE
Glucose, UA: NEGATIVE mg/dL
Hgb urine dipstick: NEGATIVE
Ketones, ur: NEGATIVE mg/dL
Leukocytes, UA: NEGATIVE
Nitrite: NEGATIVE
Protein, ur: NEGATIVE
Protein, ur: NEGATIVE mg/dL
Specific Gravity, Urine: 1.02 (ref 1.005–1.030)
Urobilinogen, UA: 0.2
Urobilinogen, UA: 0.2 mg/dL (ref 0.0–1.0)
pH: 7 (ref 5.0–8.0)

## 2011-04-25 MED ORDER — MEDROXYPROGESTERONE ACETATE 10 MG PO TABS: 10.0000 mg | ORAL_TABLET | Freq: Every day | ORAL | Status: DC

## 2011-04-25 NOTE — Progress Notes (Signed)
Right lower abdominal cramping & left side pain started yesterday. Pt states she "wants to know if she has a kidney infection".

## 2011-04-25 NOTE — ED Notes (Signed)
No pain or burning with urination, no CVA tenderness.

## 2011-04-25 NOTE — Progress Notes (Signed)
No period for a month, has not done test.  lmp 08/22.  Low back started yesterday left- flank.

## 2011-04-25 NOTE — ED Provider Notes (Signed)
Attestation of Attending Supervision of Advanced Practitioner: Evaluation and management procedures were performed by the PA/NP/CNM/OB Fellow under my supervision/collaboration. Chart reviewed and agree with management and plan.  Willey Due A 04/25/2011 2:59 PM   

## 2011-04-25 NOTE — ED Provider Notes (Signed)
History   Pt presents today stating she has still not had menses. She was due at the beginning of September. She also c/o lower abd cramping and is concerned she might have a kidney infection. She states the anaprox given to her at her last visit does help with the pain.  Chief Complaint  Patient presents with  . Back Pain   HPI  OB History    Grav Para Term Preterm Abortions TAB SAB Ect Mult Living   4 4 4  0 0 0 0 0 0 4      Past Medical History  Diagnosis Date  . No pertinent past medical history     Past Surgical History  Procedure Date  . Cesarean section     No family history on file.  History  Substance Use Topics  . Smoking status: Current Everyday Smoker  . Smokeless tobacco: Not on file  . Alcohol Use: Yes    Allergies: No Known Allergies  Prescriptions prior to admission  Medication Sig Dispense Refill  . naproxen sodium (ANAPROX DS) 550 MG tablet Take 1 tablet (550 mg total) by mouth 2 (two) times daily with a meal.  30 tablet  0    Review of Systems  Constitutional: Negative for fever.  Cardiovascular: Negative for chest pain.  Gastrointestinal: Positive for abdominal pain. Negative for nausea, vomiting, diarrhea and constipation.  Genitourinary: Negative for dysuria, urgency, frequency and hematuria.  Neurological: Negative for dizziness and headaches.  Psychiatric/Behavioral: Negative for depression and suicidal ideas.   Physical Exam   Blood pressure 132/83, pulse 70, temperature 98.3 F (36.8 C), temperature source Oral, resp. rate 20, height 5\' 8"  (1.727 m), weight 208 lb (94.348 kg), last menstrual period 03/05/2011.  Physical Exam  Constitutional: She is oriented to person, place, and time. She appears well-developed and well-nourished. No distress.  HENT:  Head: Normocephalic and atraumatic.  Eyes: EOM are normal. Pupils are equal, round, and reactive to light.  GI: Soft. She exhibits no distension. There is no tenderness. There is no  rebound and no guarding.  Genitourinary: Uterus normal. Uterus is not tender. Cervix exhibits no motion tenderness, no discharge and no friability. Right adnexum displays no mass, no tenderness and no fullness. Left adnexum displays no mass, no tenderness and no fullness. No bleeding around the vagina. No vaginal discharge found.  Neurological: She is alert and oriented to person, place, and time.  Skin: Skin is warm and dry. She is not diaphoretic.  Psychiatric: She has a normal mood and affect. Her behavior is normal. Judgment and thought content normal.    MAU Course  Procedures  Results for orders placed during the hospital encounter of 04/25/11 (from the past 24 hour(s))  URINALYSIS, ROUTINE W REFLEX MICROSCOPIC     Status: Abnormal   Collection Time   04/25/11 12:52 PM      Component Value Range   Color, Urine YELLOW  YELLOW    Appearance CLEAR  CLEAR    Specific Gravity, Urine 1.020  1.005 - 1.030    pH 7.0  5.0 - 8.0    Glucose, UA NEGATIVE  NEGATIVE (mg/dL)   Hgb urine dipstick TRACE (*) NEGATIVE    Bilirubin Urine NEGATIVE  NEGATIVE    Ketones, ur NEGATIVE  NEGATIVE (mg/dL)   Protein, ur NEGATIVE  NEGATIVE (mg/dL)   Urobilinogen, UA 0.2  0.0 - 1.0 (mg/dL)   Nitrite NEGATIVE  NEGATIVE    Leukocytes, UA NEGATIVE  NEGATIVE   URINE MICROSCOPIC-ADD ON  Status: Abnormal   Collection Time   04/25/11 12:52 PM      Component Value Range   Squamous Epithelial / LPF FEW (*) RARE    WBC, UA 0-2  <3 (WBC/hpf)   RBC / HPF 3-6  <3 (RBC/hpf)  POCT PREGNANCY, URINE     Status: Normal   Collection Time   04/25/11 12:58 PM      Component Value Range   Preg Test, Ur NEGATIVE     Urine sent for culture.  [place] from 9/11 reviewed and was NL.  Assessment and Plan  Pelvic pain: discussed with pt at length. Will await urine culture. Will give Rx for provera. She will f/u in the GYN clinic. Discussed diet, activity, risks, and precautions.  Clinton Gallant. Rice III, DrHSc, MPAS,  PA-C  04/25/2011, 2:21 PM   [name], PA 04/25/11 1426

## 2011-04-26 LAB — URINE CULTURE
Colony Count: 15000
Culture  Setup Time: 201209260111

## 2011-04-27 LAB — URINALYSIS, ROUTINE W REFLEX MICROSCOPIC
Bilirubin Urine: NEGATIVE
Glucose, UA: NEGATIVE
Hgb urine dipstick: NEGATIVE
Ketones, ur: 15 — AB
Protein, ur: NEGATIVE

## 2011-04-27 LAB — WET PREP, GENITAL
Trich, Wet Prep: NONE SEEN
Yeast Wet Prep HPF POC: NONE SEEN

## 2011-04-27 LAB — POCT PREGNANCY, URINE: Operator id: 28886

## 2011-04-27 LAB — GC/CHLAMYDIA PROBE AMP, GENITAL: Chlamydia, DNA Probe: NEGATIVE

## 2011-04-28 LAB — GC/CHLAMYDIA PROBE AMP, GENITAL
Chlamydia, DNA Probe: NEGATIVE
GC Probe Amp, Genital: NEGATIVE

## 2011-04-28 LAB — URINALYSIS, ROUTINE W REFLEX MICROSCOPIC
Bilirubin Urine: NEGATIVE
Ketones, ur: NEGATIVE
Leukocytes, UA: NEGATIVE
Nitrite: NEGATIVE
Specific Gravity, Urine: 1.02
Urobilinogen, UA: 0.2

## 2011-04-28 LAB — WET PREP, GENITAL: Trich, Wet Prep: NONE SEEN

## 2011-04-28 LAB — URINE MICROSCOPIC-ADD ON

## 2011-05-05 LAB — URINE MICROSCOPIC-ADD ON

## 2011-05-05 LAB — URINALYSIS, ROUTINE W REFLEX MICROSCOPIC
Glucose, UA: NEGATIVE mg/dL
Leukocytes, UA: NEGATIVE
pH: 6 (ref 5.0–8.0)

## 2011-05-09 LAB — POCT URINALYSIS DIP (DEVICE)
Nitrite: NEGATIVE
Protein, ur: NEGATIVE
Urobilinogen, UA: 0.2
pH: 6

## 2011-05-09 LAB — HEPATIC FUNCTION PANEL
ALT: 22
AST: 27
Albumin: 4.1
Alkaline Phosphatase: 65
Total Bilirubin: 0.6

## 2011-05-09 LAB — I-STAT 8, (EC8 V) (CONVERTED LAB)
BUN: 6
Bicarbonate: 23.2
HCT: 42
Operator id: 285491
pCO2, Ven: 38.8 — ABNORMAL LOW

## 2011-05-09 LAB — CBC
MCHC: 31.6
RDW: 18.6 — ABNORMAL HIGH

## 2011-05-09 LAB — DIFFERENTIAL
Basophils Absolute: 0
Basophils Relative: 0
Monocytes Absolute: 0.3
Neutro Abs: 10.7 — ABNORMAL HIGH
Neutrophils Relative %: 90 — ABNORMAL HIGH

## 2011-05-09 LAB — LIPASE, BLOOD: Lipase: 17

## 2011-05-09 LAB — URINE MICROSCOPIC-ADD ON

## 2011-05-09 LAB — URINALYSIS, ROUTINE W REFLEX MICROSCOPIC
Bilirubin Urine: NEGATIVE
Glucose, UA: NEGATIVE
Specific Gravity, Urine: 1.021
pH: 8

## 2011-05-09 LAB — POCT I-STAT CREATININE: Creatinine, Ser: 0.9

## 2011-05-10 LAB — URINALYSIS, ROUTINE W REFLEX MICROSCOPIC
Bilirubin Urine: NEGATIVE
Glucose, UA: NEGATIVE
Hgb urine dipstick: NEGATIVE
Ketones, ur: NEGATIVE
Nitrite: NEGATIVE
Protein, ur: NEGATIVE
Specific Gravity, Urine: 1.029
Urobilinogen, UA: 0.2
pH: 6

## 2011-05-10 LAB — PREGNANCY, URINE: Preg Test, Ur: NEGATIVE

## 2011-08-27 ENCOUNTER — Emergency Department (HOSPITAL_COMMUNITY)
Admission: EM | Admit: 2011-08-27 | Discharge: 2011-08-27 | Disposition: A | Discharge status: Home / Self Care | Payer: Self-pay | Attending: Emergency Medicine | Admitting: Emergency Medicine

## 2011-08-27 ENCOUNTER — Other Ambulatory Visit: Payer: Self-pay

## 2011-08-27 ENCOUNTER — Emergency Department (HOSPITAL_COMMUNITY): Payer: Self-pay

## 2011-08-27 ENCOUNTER — Encounter (HOSPITAL_COMMUNITY): Payer: Self-pay | Admitting: *Deleted

## 2011-08-27 DIAGNOSIS — R112 Nausea with vomiting, unspecified: Secondary | ICD-10-CM | POA: Insufficient documentation

## 2011-08-27 DIAGNOSIS — R1013 Epigastric pain: Secondary | ICD-10-CM | POA: Insufficient documentation

## 2011-08-27 DIAGNOSIS — R079 Chest pain, unspecified: Secondary | ICD-10-CM | POA: Insufficient documentation

## 2011-08-27 DIAGNOSIS — R42 Dizziness and giddiness: Secondary | ICD-10-CM | POA: Insufficient documentation

## 2011-08-27 DIAGNOSIS — R0602 Shortness of breath: Secondary | ICD-10-CM | POA: Insufficient documentation

## 2011-08-27 DIAGNOSIS — R197 Diarrhea, unspecified: Secondary | ICD-10-CM | POA: Insufficient documentation

## 2011-08-27 DIAGNOSIS — R5383 Other fatigue: Secondary | ICD-10-CM | POA: Insufficient documentation

## 2011-08-27 DIAGNOSIS — R5381 Other malaise: Secondary | ICD-10-CM | POA: Insufficient documentation

## 2011-08-27 LAB — COMPREHENSIVE METABOLIC PANEL
ALT: 16 U/L (ref 0–35)
AST: 19 U/L (ref 0–37)
Alkaline Phosphatase: 61 U/L (ref 39–117)
GFR calc Af Amer: >90 mL/min (ref >90)
Glucose, Bld: 141 mg/dL — ABNORMAL HIGH (ref 70–99)
Potassium: 3.7 mEq/L (ref 3.5–5.1)
Sodium: 143 mEq/L (ref 135–145)
Total Protein: 7.5 g/dL (ref 6.0–8.3)

## 2011-08-27 LAB — URINALYSIS, ROUTINE W REFLEX MICROSCOPIC
Bilirubin Urine: NEGATIVE
Glucose, UA: NEGATIVE mg/dL
Specific Gravity, Urine: 1.012 (ref 1.005–1.030)
Urobilinogen, UA: 0.2 mg/dL (ref 0.0–1.0)
pH: 8.5 — ABNORMAL HIGH (ref 5.0–8.0)

## 2011-08-27 LAB — POCT PREGNANCY, URINE: Preg Test, Ur: NEGATIVE

## 2011-08-27 LAB — CBC
Platelets: 408 10*3/uL — ABNORMAL HIGH (ref 150–400)
RBC: 4.57 MIL/uL (ref 3.87–5.11)
WBC: 6.9 10*3/uL (ref 4.0–10.5)

## 2011-08-27 LAB — DIFFERENTIAL
Basophils Absolute: 0 10*3/uL (ref 0.0–0.1)
Eosinophils Absolute: 0 10*3/uL (ref 0.0–0.7)
Lymphocytes Relative: 16 % (ref 12–46)
Lymphs Abs: 1.1 10*3/uL (ref 0.7–4.0)
Neutrophils Relative %: 81 % — ABNORMAL HIGH (ref 43–77)

## 2011-08-27 LAB — URINE MICROSCOPIC-ADD ON

## 2011-08-27 MED ORDER — SODIUM CHLORIDE 0.9 % IV SOLN
INTRAVENOUS | Status: DC
  Administered 2011-08-27: 1000 mL via INTRAVENOUS

## 2011-08-27 MED ORDER — SODIUM CHLORIDE 0.9 % IV BOLUS (SEPSIS)
1000.0000 mL | Freq: Once | INTRAVENOUS | Status: AC
  Administered 2011-08-27: 1000 mL via INTRAVENOUS

## 2011-08-27 MED ORDER — HYDROMORPHONE HCL PF 1 MG/ML IJ SOLN
1.0000 mg | Freq: Once | INTRAMUSCULAR | Status: AC
  Administered 2011-08-27: 1 mg via INTRAVENOUS
  Filled 2011-08-27: qty 1

## 2011-08-27 MED ORDER — ONDANSETRON HCL 4 MG/2ML IJ SOLN
4.0000 mg | Freq: Once | INTRAMUSCULAR | Status: AC
  Administered 2011-08-27: 4 mg via INTRAVENOUS
  Filled 2011-08-27: qty 2

## 2011-08-27 MED ORDER — HYDROCODONE-ACETAMINOPHEN 5-325 MG PO TABS: 2.0000 | ORAL_TABLET | ORAL | Status: AC | PRN

## 2011-08-27 MED ORDER — PROMETHAZINE HCL 25 MG PO TABS: 25.0000 mg | ORAL_TABLET | Freq: Four times a day (QID) | ORAL | Status: AC | PRN

## 2011-08-27 NOTE — ED Notes (Signed)
Vital signs stable. 

## 2011-08-27 NOTE — ED Notes (Signed)
Pt sts she has vomited 12-15 times since 0300 this am. And has abd and chest pain, dry heaves noted on assessment, pt not cooperating with staff when attempting to assess pt and place on monitor. Pt airway intact. nad noted.

## 2011-08-27 NOTE — ED Notes (Signed)
Pt feels better, given ice chips per md ok.

## 2011-08-27 NOTE — ED Notes (Signed)
Patient transported to X-ray and returned, placed back on monitor and urine obtained and sent.

## 2011-08-27 NOTE — ED Notes (Signed)
Patient is resting comfortably. 

## 2011-08-27 NOTE — ED Provider Notes (Signed)
History     CSN: [id number]  Arrival date & time 08/27/11  4098   First MD Initiated Contact with Patient 08/27/11 [phone]      Chief Complaint  Patient presents with  . Abdominal Pain    (Consider location/radiation/quality/duration/timing/severity/associated sxs/prior treatment) HPI Comments: Patient presents to ED complaining of abdominal pain and vomiting since 3 am this morning.  She reports she has had 13 episodes of vomiting and some non-bloody diarrhea.  The vomit is yellow and clear.  She reports some mild chest pain, SOB, and light headedness as well.  She locates her chest pain in her epigastric region.  Denies fever, coughing, urinary sx, and flank pain.  Prev hx of Caesarian section but no other medical hx.  She has not taken anything for pain.  Last menstrual period was 1 week ago.  History not concerning for PE: no recent travel, OCP, h/o blood clots, unilateral leg swelling.  Patient is a 31 y.o. female presenting with vomiting. The history is provided by the patient.  Emesis  This is a new problem. Associated symptoms include abdominal pain and diarrhea. Pertinent negatives include no cough, no fever, no headaches and no myalgias.    Past Medical History  Diagnosis Date  . No pertinent past medical history     Past Surgical History  Procedure Date  . Cesarean section     History reviewed. No pertinent family history.  History  Substance Use Topics  . Smoking status: Current Everyday Smoker  . Smokeless tobacco: Not on file  . Alcohol Use: Yes    OB History    Grav Para Term Preterm Abortions TAB SAB Ect Mult Living   4 4 4  0 0 0 0 0 0 4      Review of Systems  Constitutional: Negative for fever and diaphoresis.  HENT: Negative for sore throat, rhinorrhea and neck pain.   Eyes: Negative for discharge.  Respiratory: Positive for shortness of breath. Negative for cough, choking and wheezing.   Cardiovascular: Positive for chest pain. Negative for leg  swelling.  Gastrointestinal: Positive for nausea, vomiting, abdominal pain and diarrhea.  Genitourinary: Negative for dysuria and flank pain.  Musculoskeletal: Negative for myalgias.  Skin: Negative for pallor.  Neurological: Positive for weakness and light-headedness. Negative for syncope and headaches.    Allergies  Review of patient's allergies indicates no known allergies.  Home Medications  No current outpatient prescriptions on file.  BP 159/109  Pulse 72  Temp(Src) 97.3 F (36.3 C) (Oral)  Resp 16  SpO2 100%  LMP 08/20/2011  Physical Exam  Nursing note and vitals reviewed. Constitutional: She is oriented to person, place, and time. She appears well-developed and well-nourished.  HENT:  Head: Normocephalic and atraumatic.  Eyes: Right eye exhibits no discharge. Left eye exhibits no discharge.  Neck: Normal range of motion. Neck supple.  Cardiovascular: Normal rate, regular rhythm, normal heart sounds and intact distal pulses.   No murmur heard. Pulmonary/Chest: Effort normal and breath sounds normal. No respiratory distress. She has no wheezes.  Abdominal: Soft. Bowel sounds are normal. There is tenderness in the epigastric area. There is no rigidity, no rebound, no guarding, no tenderness at McBurney's point and negative Murphy's sign.    Musculoskeletal: Normal range of motion.  Neurological: She is alert and oriented to person, place, and time.  Skin: Skin is warm and dry. No rash noted.  Psychiatric: She has a normal mood and affect.    ED Course  Procedures (including  critical care time)  Labs Reviewed  CBC - Abnormal; Notable for the following:    Hemoglobin 11.6 (*)    HCT 35.4 (*)    MCV 77.5 (*)    MCH 25.4 (*)    RDW 15.9 (*)    Platelets 408 (*)    All other components within normal limits  DIFFERENTIAL - Abnormal; Notable for the following:    Neutrophils Relative 81 (*)    Monocytes Relative 2 (*)    All other components within normal limits    COMPREHENSIVE METABOLIC PANEL - Abnormal; Notable for the following:    Glucose, Bld 141 (*)    Total Bilirubin 0.2 (*)    All other components within normal limits  URINALYSIS, ROUTINE W REFLEX MICROSCOPIC - Abnormal; Notable for the following:    APPearance CLOUDY (*)    pH 8.5 (*)    Hgb urine dipstick LARGE (*)    All other components within normal limits  LIPASE, BLOOD  POCT PREGNANCY, URINE  URINE MICROSCOPIC-ADD ON  POCT PREGNANCY, URINE   Dg Chest 2 View  08/27/2011  *RADIOLOGY REPORT*  Clinical Data: Left-sided epigastric pain  CHEST - 2 VIEW  Comparison: 07/29/2009; 07/02/2007  Findings:  Interval enlargement of cardiac silhouette, most conspicuous on the lateral radiograph.  Unchanged mediastinal contours.  No focal parenchymal opacities.  No pleural effusion or pneumothorax. Unchanged bones.  IMPRESSION: 1.  Apparent interval enlargement of the cardiac silhouette. Further evaluation may be obtained with a cardiac echo as clinically indicated. 2.  No focal airspace opacities to suggest pneumonia.  Original Report Authenticated By: Waynard Reeds, M.D.     1. Nausea vomiting and diarrhea      Date: 08/27/2011  Rate: 64  Rhythm: normal sinus rhythm  QRS Axis: normal  Intervals: normal  ST/T Wave abnormalities: early repolarization  Conduction Disutrbances:none  Narrative Interpretation:   Old EKG Reviewed: unchanged  8:53 AM Patient seen and examined.   11:21 AM Patent feeling much better, tolerating POs in room.  Patient discharged to home. Encouraged to rest and drink plenty of fluids.  Patient told to return to ED or see their primary doctor if their symptoms worsen, high fever not controlled with tylenol, persistent vomiting, they feel they are dehydrated, or if they have any other concerns.  Patient verbalized understanding and agreed with plan.    Informed patient of x-ray findings regarding her heart. Asked her to follow-up with PCP regarding this. Referrals  given.   Counseled to start taking OTC omeprazole for heart burn symptoms.    MDM  Patient with symptoms consistent with viral gastroenteritis.  Suspect component of GERD as well with chronic heartburn symptoms. Vitals are stable, no fever.  No suspicion for PE -- normal vitals, no tachypnea, normal O2 sat.  No signs of dehydration, tolerating PO's.  Lungs are clear.  No focal abdominal pain, no concern for appendicitis, cholecystitis, pancreatitis, ruptured viscus, UTI, kidney stone, or any other abdominal etiology.  Supportive therapy indicated with return if symptoms worsen.  Patient counseled. Informed of cardiomegaly and need to follow-up. No suspicion of pericarditis given presentation of N/V/D, EKG findings. Pt urged to f/u for eval.          Carolee Rota, PA 08/27/11 1141

## 2011-08-27 NOTE — ED Notes (Signed)
The pt is c/o abd  With nv and diarrhea since last pm or early am.  lmp last week.  C/o some chest pain with vomiting

## 2011-08-27 NOTE — ED Provider Notes (Signed)
Medical screening examination/treatment/procedure(s) were performed by non-physician practitioner and as supervising physician I was immediately available for consultation/collaboration.   Keenan Trefry A Ger Ringenberg, MD 08/27/11 1144 

## 2011-10-05 ENCOUNTER — Emergency Department (HOSPITAL_COMMUNITY)
Admission: EM | Admit: 2011-10-05 | Discharge: 2011-10-05 | Disposition: A | Discharge status: Home Health | Payer: Self-pay | Attending: Emergency Medicine | Admitting: Emergency Medicine

## 2011-10-05 ENCOUNTER — Encounter (HOSPITAL_COMMUNITY): Payer: Self-pay | Admitting: Emergency Medicine

## 2011-10-05 ENCOUNTER — Emergency Department (HOSPITAL_COMMUNITY): Payer: Self-pay

## 2011-10-05 DIAGNOSIS — K029 Dental caries, unspecified: Secondary | ICD-10-CM | POA: Insufficient documentation

## 2011-10-05 DIAGNOSIS — R630 Anorexia: Secondary | ICD-10-CM | POA: Insufficient documentation

## 2011-10-05 DIAGNOSIS — T394X1A Poisoning by antirheumatics, not elsewhere classified, accidental (unintentional), initial encounter: Secondary | ICD-10-CM | POA: Insufficient documentation

## 2011-10-05 DIAGNOSIS — T50905A Adverse effect of unspecified drugs, medicaments and biological substances, initial encounter: Secondary | ICD-10-CM

## 2011-10-05 DIAGNOSIS — K089 Disorder of teeth and supporting structures, unspecified: Secondary | ICD-10-CM | POA: Insufficient documentation

## 2011-10-05 DIAGNOSIS — R1013 Epigastric pain: Secondary | ICD-10-CM | POA: Insufficient documentation

## 2011-10-05 DIAGNOSIS — K0889 Other specified disorders of teeth and supporting structures: Secondary | ICD-10-CM

## 2011-10-05 DIAGNOSIS — T39314A Poisoning by propionic acid derivatives, undetermined, initial encounter: Secondary | ICD-10-CM | POA: Insufficient documentation

## 2011-10-05 DIAGNOSIS — R112 Nausea with vomiting, unspecified: Secondary | ICD-10-CM | POA: Insufficient documentation

## 2011-10-05 LAB — URINALYSIS, ROUTINE W REFLEX MICROSCOPIC
Glucose, UA: NEGATIVE mg/dL
Hgb urine dipstick: NEGATIVE
Ketones, ur: NEGATIVE mg/dL
Leukocytes, UA: NEGATIVE
Protein, ur: NEGATIVE mg/dL
Urobilinogen, UA: 0.2 mg/dL (ref 0.0–1.0)

## 2011-10-05 LAB — DIFFERENTIAL
Lymphocytes Relative: 36 % (ref 12–46)
Lymphs Abs: 2.2 10*3/uL (ref 0.7–4.0)
Monocytes Absolute: 0.6 10*3/uL (ref 0.1–1.0)
Monocytes Relative: 10 % (ref 3–12)
Neutro Abs: 3.3 10*3/uL (ref 1.7–7.7)
Neutrophils Relative %: 53 % (ref 43–77)

## 2011-10-05 LAB — COMPREHENSIVE METABOLIC PANEL
Alkaline Phosphatase: 60 U/L (ref 39–117)
BUN: 3 mg/dL — ABNORMAL LOW (ref 6–23)
CO2: 28 mEq/L (ref 19–32)
Chloride: 104 mEq/L (ref 96–112)
Creatinine, Ser: 0.81 mg/dL (ref 0.50–1.10)
GFR calc non Af Amer: >90 mL/min (ref >90)
Potassium: 3.6 mEq/L (ref 3.5–5.1)
Total Bilirubin: 0.2 mg/dL — ABNORMAL LOW (ref 0.3–1.2)

## 2011-10-05 LAB — CBC
HCT: 33.4 % — ABNORMAL LOW (ref 36.0–46.0)
Hemoglobin: 10.8 g/dL — ABNORMAL LOW (ref 12.0–15.0)
RBC: 4.37 MIL/uL (ref 3.87–5.11)
WBC: 6.2 10*3/uL (ref 4.0–10.5)

## 2011-10-05 LAB — ACETAMINOPHEN LEVEL: Acetaminophen (Tylenol), Serum: <15 ug/mL (ref 10–30)

## 2011-10-05 LAB — LIPASE, BLOOD: Lipase: 10 U/L — ABNORMAL LOW (ref 11–59)

## 2011-10-05 LAB — SALICYLATE LEVEL: Salicylate Lvl: <2 mg/dL — ABNORMAL LOW (ref 2.8–20.0)

## 2011-10-05 LAB — PREGNANCY, URINE: Preg Test, Ur: NEGATIVE

## 2011-10-05 MED ORDER — SODIUM CHLORIDE 0.9 % IV SOLN
1000.0000 mL | Freq: Once | INTRAVENOUS | Status: AC
  Administered 2011-10-05: 1000 mL via INTRAVENOUS

## 2011-10-05 MED ORDER — ONDANSETRON HCL 4 MG/2ML IJ SOLN
4.0000 mg | Freq: Once | INTRAMUSCULAR | Status: AC
  Administered 2011-10-05: 4 mg via INTRAVENOUS
  Filled 2011-10-05: qty 2

## 2011-10-05 MED ORDER — HYDROCODONE-ACETAMINOPHEN 5-325 MG PO TABS
1.0000 | ORAL_TABLET | Freq: Once | ORAL | Status: AC
  Administered 2011-10-05: 1 via ORAL
  Filled 2011-10-05: qty 1

## 2011-10-05 MED ORDER — PENICILLIN V POTASSIUM 500 MG PO TABS: 500.0000 mg | ORAL_TABLET | Freq: Three times a day (TID) | ORAL | Status: AC

## 2011-10-05 MED ORDER — GI COCKTAIL ~~LOC~~
30.0000 mL | Freq: Once | ORAL | Status: AC
  Administered 2011-10-05: 30 mL via ORAL
  Filled 2011-10-05: qty 30

## 2011-10-05 MED ORDER — HYDROCODONE-ACETAMINOPHEN 5-325 MG PO TABS: 1.0000 | ORAL_TABLET | Freq: Four times a day (QID) | ORAL | Status: AC | PRN

## 2011-10-05 MED ORDER — PROMETHAZINE HCL 25 MG PO TABS: 25.0000 mg | ORAL_TABLET | Freq: Four times a day (QID) | ORAL | Status: AC | PRN

## 2011-10-05 MED ORDER — MORPHINE SULFATE 4 MG/ML IJ SOLN
4.0000 mg | Freq: Once | INTRAMUSCULAR | Status: AC
  Administered 2011-10-05: 4 mg via INTRAVENOUS
  Filled 2011-10-05: qty 1

## 2011-10-05 MED ORDER — SODIUM CHLORIDE 0.9 % IV SOLN: 1000.0000 mL | INTRAVENOUS | Status: DC

## 2011-10-05 NOTE — Discharge Instructions (Signed)
Dental Pain A tooth ache may be caused by cavities (tooth decay). Cavities expose the nerve of the tooth to air and hot or cold temperatures. It may come from an infection or abscess (also called a boil or furuncle) around your tooth. It is also often caused by dental caries (tooth decay). This causes the pain you are having. DIAGNOSIS  Your caregiver can diagnose this problem by exam. TREATMENT   If caused by an infection, it may be treated with medications which kill germs (antibiotics) and pain medications as prescribed by your caregiver. Take medications as directed.   Only take over-the-counter or prescription medicines for pain, discomfort, or fever as directed by your caregiver.   Whether the tooth ache today is caused by infection or dental disease, you should see your dentist as soon as possible for further care.  SEEK MEDICAL CARE IF: The exam and treatment you received today has been provided on an emergency basis only. This is not a substitute for complete medical or dental care. If your problem worsens or new problems (symptoms) appear, and you are unable to meet with your dentist, call or return to this location. SEEK IMMEDIATE MEDICAL CARE IF:   You have a fever.   You develop redness and swelling of your face, jaw, or neck.   You are unable to open your mouth.   You have severe pain uncontrolled by pain medicine.  MAKE SURE YOU:   Understand these instructions.   Will watch your condition.   Will get help right away if you are not doing well or get worse.  Document Released: 07/17/2005 Document Revised: 07/06/2011 Document Reviewed: 03/04/2008 [facility] Patient Information 2012 [place], [place].Polypharmacy Problems Polypharmacy problems can occur when you take more than one medicine. Your caregivers need to know about all the medicines you take. This includes vitamins, herbs, eyedrops, over-the-counter medicines, prescription medicines, and creams. Drug interaction  problems can include:  Bad reactions or side effects. This can occur when certain drugs are taken together.   Reduced benefit from your medicines. This can occur if one drug reduces the ability of another drug to work.  Some drug interaction problems can be life-threatening. Your caregivers can coordinate a plan to help you avoid polypharmacy problems. RISK FACTORS Polypharmacy problems are more likely to occur if:  You have many caregivers prescribing different medicines for you. This is a common problem for elderly patients.   You have a long-term (chronic) medical condition.   You have had an organ transplant.   You have human immunodeficiency virus (HIV).   You are undergoing chemotherapy.   You have hepatitis.   You have kidney disease or kidney failure.   You have significant heart disease or lung disease.   You are elderly.   You are taking medicines that have a low margin of error. This means there is little difference between the right amount of medicine and too much medicine. Medicines in this category include:   Warfarin.   Digoxin.   Lithium.   Theophylline.   Monoamine oxidase (MAO) inhibitors.   Seizure medicines.   Cyclosporine.  PREVENTION   Choose one caregiver to be in charge of coordinating your medicines. Inform this caregiver about all medicines and supplements you take, even if they are prescribed by another caregiver.   Purchase all your medicines through the same pharmacy. The pharmacy keeps track of your medicines and possible drug interactions. They can notify your caregiver of potential problems.   Read the information given  to you at your pharmacy.   Carry a list of all your medicines and their doses with you. This informs your caregivers, emergency department caregivers, and specialists about the medicines you are taking. This is especially important before you start a new medicine.   Use a system to keep track of which medicines you  are taking and when. Many patients use a pillbox that separates medicines by the day and time they are supposed to be taken.   Carry a list of your chronic medical problems with you. Conditions such as kidney problems, liver problems, organ transplants, and chronic viral infections affect the way your body handles medicines.   Ask your caregiver or pharmacist if you have any questions about your medicines.  SEEK MEDICAL CARE IF:  You have any problems that may be caused by your medicines.   You have chest pain.   You have shortness of breath.   You have an irregular heartbeat.   You have fainting spells.   You have shaking or tremors.   You have weakness or tiredness (lethargy).   You have a rash or swelling in any part of the body.   You have increased bleeding, rectal bleeding, vaginal bleeding, or you bruise easily.   You have abdominal pain.   You have nausea.   You have vomiting.  Document Released: 08/24/2004 Document Revised: 07/06/2011 Document Reviewed: 04/05/2011 [facility] Patient Information 2012 [place], [place].

## 2011-10-05 NOTE — ED Provider Notes (Signed)
History     CSN: [id number]  Arrival date & time 10/05/11  0708   First MD Initiated Contact with Patient 10/05/11 [phone]      Chief Complaint  Patient presents with  . Ingestion    (Consider location/radiation/quality/duration/timing/severity/associated sxs/prior treatment) HPI Pt complains of a severe abscessed tooth.  Her toothache started 2 days ago.  Over the last 6 hours she has taken 12 motrin, 4 aspirin , 2 goody powdy powder as well as nyquil for the pain.  Now she has nausea and vomiting.  She now has stomache soreness as well.  No fever or cough.  No shortness of breath.  No blood in stool.  She states she's been vomiting numerous times and cannot keep anything down. The tooth pain is in the right upper molar region. It increases with chewing and palpation. Patient denies any suicidal ideation or depression. She states she is taking those medications because of the pain and discomfort. She has not been able to see a dentist.   Past Medical History  Diagnosis Date  . No pertinent past medical history     Past Surgical History  Procedure Date  . Cesarean section     No family history on file.  History  Substance Use Topics  . Smoking status: Current Everyday Smoker  . Smokeless tobacco: Not on file  . Alcohol Use: Yes    OB History    Grav Para Term Preterm Abortions TAB SAB Ect Mult Living   4 4 4  0 0 0 0 0 0 4      Review of Systems  All other systems reviewed and are negative.    Allergies  Review of patient's allergies indicates no known allergies.  Home Medications  No current outpatient prescriptions on file.  BP 137/98  Pulse 70  Temp(Src) 97.8 F (36.6 C) (Oral)  SpO2 98%  Physical Exam  Nursing note and vitals reviewed. Constitutional: She appears well-developed and well-nourished.  HENT:  Head: Normocephalic and atraumatic.  Right Ear: External ear normal.  Left Ear: External ear normal.  Mouth/Throat: Uvula is midline, oropharynx is  clear and moist and mucous membranes are normal.         Dental caries noted at the depicted area of tenderness   Eyes: Conjunctivae are normal. Right eye exhibits no discharge. Left eye exhibits no discharge. No scleral icterus.  Neck: Neck supple. No tracheal deviation present.  Cardiovascular: Normal rate, regular rhythm and intact distal pulses.   Pulmonary/Chest: Effort normal and breath sounds normal. No stridor. No respiratory distress. She has no wheezes. She has no rales.  Abdominal: Soft. Bowel sounds are normal. She exhibits no distension. There is tenderness in the epigastric area. There is no rebound and no guarding.  Musculoskeletal: She exhibits no edema and no tenderness.  Neurological: She is alert. She has normal strength. No sensory deficit. Cranial nerve deficit:  no gross defecits noted. She exhibits normal muscle tone. She displays no seizure activity. Coordination normal.  Skin: Skin is warm and dry. No rash noted.  Psychiatric: She has a normal mood and affect.    ED Course  Procedures (including critical care time)  Labs Reviewed  CBC - Abnormal; Notable for the following:    Hemoglobin 10.8 (*)    HCT 33.4 (*)    MCV 76.4 (*)    MCH 24.7 (*)    RDW 15.6 (*)    All other components within normal limits  COMPREHENSIVE METABOLIC PANEL - Abnormal;  Notable for the following:    BUN 3 (*)    AST 68 (*)    Total Bilirubin 0.2 (*)    All other components within normal limits  URINALYSIS, ROUTINE W REFLEX MICROSCOPIC - Abnormal; Notable for the following:    APPearance HAZY (*)    All other components within normal limits  LIPASE, BLOOD - Abnormal; Notable for the following:    Lipase 10 (*)    All other components within normal limits  SALICYLATE LEVEL - Abnormal; Notable for the following:    Salicylate Lvl <2.0 (*)    All other components within normal limits  DIFFERENTIAL  ACETAMINOPHEN LEVEL  PREGNANCY, URINE   Dg Abd Acute W/chest  10/05/2011   *RADIOLOGY REPORT*  Clinical Data: 31 year old female with lower abdominal pain.  ACUTE ABDOMEN SERIES (ABDOMEN 2 VIEW & CHEST 1 VIEW)  Comparison: 08/27/2011.  Findings: Normal lung volumes.  Cardiac size and mediastinal contours are within normal limits.  Visualized tracheal air column is within normal limits.  The lungs are clear.  No pneumothorax or pneumoperitoneum.  Nonobstructed bowel gas pattern.  Abdominal and pelvic visceral contours are within normal limits. No osseous abnormality identified.  IMPRESSION: Nonobstructed bowel gas pattern, no free air.  Negative chest.  Original Report Authenticated By: Harley Hallmark, M.D.     1. Medication adverse effect   2. Toothache       MDM  Patient took an overdose of 200mg  ibuprofen, aspirin and Goody powder. There was no suicidal intent. She was taking these medications to treat her toothache. This ingestion fortunately  is non-toxic. There are no significant lab abnormalities noted. She does have mild anemia but this appears to be stable. At this time there does not appear to be evidence of an acute emergency medical condition. I have cautioned the patient cannot exceed the recommended doses of medications again in the future. I will prescribe her a course of antibiotics for her toothache. I have provided her the name of a dentist.         Celene Kras, MD 10/05/11 1016

## 2011-10-05 NOTE — ED Notes (Signed)
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 States she took a lot  Not sure how many

## 2011-10-05 NOTE — ED Notes (Signed)
Pt as of 6 hours ago began taking 12 motrin and 4 aspirin; 2 goody powder and nyquil. Reports she took because of abscessed tooth. Medical hx of enlarged heart; no meds; no other hx; no allergies.

## 2011-10-05 NOTE — ED Notes (Signed)
Patient states that she has been having severe dental pain and has been taking entirely too much otc meds and has now developed abdominal distress. She is alert and oriented. Iv started and protocols initiated.

## 2011-12-05 ENCOUNTER — Emergency Department (HOSPITAL_COMMUNITY)
Admission: EM | Admit: 2011-12-05 | Discharge: 2011-12-05 | Disposition: A | Discharge status: Home / Self Care | Payer: Self-pay | Attending: Emergency Medicine | Admitting: Emergency Medicine

## 2011-12-05 ENCOUNTER — Encounter (HOSPITAL_COMMUNITY): Payer: Self-pay

## 2011-12-05 ENCOUNTER — Emergency Department (HOSPITAL_COMMUNITY): Payer: Self-pay

## 2011-12-05 DIAGNOSIS — N949 Unspecified condition associated with female genital organs and menstrual cycle: Secondary | ICD-10-CM | POA: Insufficient documentation

## 2011-12-05 DIAGNOSIS — R300 Dysuria: Secondary | ICD-10-CM | POA: Insufficient documentation

## 2011-12-05 DIAGNOSIS — R1110 Vomiting, unspecified: Secondary | ICD-10-CM | POA: Insufficient documentation

## 2011-12-05 DIAGNOSIS — R102 Pelvic and perineal pain: Secondary | ICD-10-CM

## 2011-12-05 DIAGNOSIS — N898 Other specified noninflammatory disorders of vagina: Secondary | ICD-10-CM | POA: Insufficient documentation

## 2011-12-05 DIAGNOSIS — R1032 Left lower quadrant pain: Secondary | ICD-10-CM | POA: Insufficient documentation

## 2011-12-05 LAB — URINALYSIS, ROUTINE W REFLEX MICROSCOPIC
Bilirubin Urine: NEGATIVE
Hgb urine dipstick: NEGATIVE
Ketones, ur: NEGATIVE mg/dL
Nitrite: NEGATIVE
Urobilinogen, UA: 0.2 mg/dL (ref 0.0–1.0)

## 2011-12-05 LAB — WET PREP, GENITAL
Trich, Wet Prep: NONE SEEN
Yeast Wet Prep HPF POC: NONE SEEN

## 2011-12-05 LAB — URINE MICROSCOPIC-ADD ON

## 2011-12-05 LAB — GC/CHLAMYDIA PROBE AMP, GENITAL: GC Probe Amp, Genital: NEGATIVE

## 2011-12-05 MED ORDER — IBUPROFEN 800 MG PO TABS
800.0000 mg | ORAL_TABLET | Freq: Once | ORAL | Status: AC
  Administered 2011-12-05: 800 mg via ORAL
  Filled 2011-12-05: qty 1

## 2011-12-05 MED ORDER — AZITHROMYCIN 250 MG PO TABS
1000.0000 mg | ORAL_TABLET | Freq: Once | ORAL | Status: AC
  Administered 2011-12-05: 1000 mg via ORAL
  Filled 2011-12-05: qty 4

## 2011-12-05 MED ORDER — CEFTRIAXONE SODIUM 250 MG IJ SOLR
250.0000 mg | Freq: Once | INTRAMUSCULAR | Status: AC
  Administered 2011-12-05: 250 mg via INTRAMUSCULAR
  Filled 2011-12-05: qty 250

## 2011-12-05 MED ORDER — IBUPROFEN 800 MG PO TABS: 800.0000 mg | ORAL_TABLET | Freq: Three times a day (TID) | ORAL | Status: AC | PRN

## 2011-12-05 NOTE — ED Notes (Signed)
Pt c/o LLQ pain and tenderness x2wks, vaginal discharge with slight odor x3days, denies n/v/d

## 2011-12-05 NOTE — ED Notes (Signed)
Patient transported to Ultrasound 

## 2011-12-05 NOTE — ED Provider Notes (Addendum)
History     CSN: [id number]  Arrival date & time 12/05/11  0730   First MD Initiated Contact with Patient 12/05/11 [phone]      Chief Complaint  Patient presents with  . Abdominal Pain  . Vaginal Discharge    (Consider location/radiation/quality/duration/timing/severity/associated sxs/prior treatment) HPI Complains of left lower abdominal pain onset 2 weeks ago constant gradual accompanied by vaginal discharge onset 5 days ago. Pain is nonradiating, sharp in quality moderate nothing makes pain better or worse no treatment prior to coming here. Vomited one time 5 days ago appetite has been good since. Also admits to dysuria for one month. No other associated symptoms.Treated with Motrin IB without relief Past Medical History  Diagnosis Date  . No pertinent past medical history     Past Surgical History  Procedure Date  . Cesarean section    gravida 4 para 4  No family history on file.  History  Substance Use Topics  . Smoking status: Current Everyday Smoker  . Smokeless tobacco: Not on file  . Alcohol Use: Yes    OB History    Grav Para Term Preterm Abortions TAB SAB Ect Mult Living   4 4 4  0 0 0 0 0 0 4      Review of Systems  Constitutional: Negative.   HENT: Negative.   Respiratory: Negative.   Cardiovascular: Negative.   Gastrointestinal: Positive for vomiting and abdominal pain.  Genitourinary: Positive for vaginal discharge.  Musculoskeletal: Negative.   Skin: Negative.   Neurological: Negative.   Hematological: Negative.   Psychiatric/Behavioral: Negative.   All other systems reviewed and are negative.    Allergies  Review of patient's allergies indicates no known allergies.  Home Medications   Current Outpatient Rx  Name Route Sig Dispense Refill  . MOTRIN PO Oral Take 4 tablets by mouth every 6 (six) hours as needed. For pain    . PENICILLIN V POTASSIUM 500 MG PO TABS Oral Take 250 mg by mouth 3 (three) times daily. For 28 days      BP 142/83   Pulse 55  Temp(Src) 98.1 F (36.7 C) (Oral)  Resp 20  SpO2 100%  LMP 11/18/2011  Physical Exam  Nursing note and vitals reviewed. Constitutional: She appears well-developed and well-nourished.  HENT:  Head: Normocephalic and atraumatic.  Eyes: Conjunctivae are normal. Pupils are equal, round, and reactive to light.  Neck: Neck supple. No tracheal deviation present. No thyromegaly present.  Cardiovascular: Normal rate and regular rhythm.   No murmur heard. Pulmonary/Chest: Effort normal and breath sounds normal.  Abdominal: Soft. Bowel sounds are normal. She exhibits no distension. There is no tenderness.  Genitourinary:       No external lesion cervical os closed, slight amount of clear vaginal discharge. No cervical motion tenderness positive left adnexal tenderness no masses  Musculoskeletal: Normal range of motion. She exhibits no edema and no tenderness.  Neurological: She is alert. Coordination normal.  Skin: Skin is warm and dry. No rash noted.  Psychiatric: She has a normal mood and affect.    ED Course  Procedures (including critical care time) 10:05 a.m. patient resting comfortably after treatment with ibuprofen pain is under control.  Labs Reviewed  POCT PREGNANCY, URINE  URINALYSIS, ROUTINE W REFLEX MICROSCOPIC   No results found.   No diagnosis found.    MDM  Pain explained by ruptured ovarian cyst, however in light of patient's concern for STDs and history vaginal discharge no treatment with antibiotics, Rocephin andzithromax  Plan prescription ibuprofen Referral local health department Diagnosis pelvic pain        Doug Sou, MD 12/05/11 1012  Doug Sou, MD 12/05/11 1651

## 2011-12-05 NOTE — Discharge Instructions (Signed)
Go to the local health department for routine gynecologic care on a yearly basis. If you continue to have pain in 3 or 4 days return or go to the maternity admissions unit at [facility] hospital. Take the medication prescribed as needed for pain

## 2011-12-06 ENCOUNTER — Encounter: Payer: Self-pay | Admitting: Obstetrics and Gynecology

## 2012-01-05 ENCOUNTER — Encounter: Payer: Self-pay | Admitting: Obstetrics and Gynecology

## 2012-03-22 ENCOUNTER — Encounter (HOSPITAL_COMMUNITY): Payer: Self-pay | Admitting: Emergency Medicine

## 2012-03-22 ENCOUNTER — Emergency Department (HOSPITAL_COMMUNITY)
Admission: EM | Admit: 2012-03-22 | Discharge: 2012-03-22 | Disposition: A | Discharge status: Home / Self Care | Payer: Self-pay | Attending: Emergency Medicine | Admitting: Emergency Medicine

## 2012-03-22 DIAGNOSIS — D649 Anemia, unspecified: Secondary | ICD-10-CM | POA: Insufficient documentation

## 2012-03-22 DIAGNOSIS — R51 Headache: Secondary | ICD-10-CM | POA: Insufficient documentation

## 2012-03-22 DIAGNOSIS — M549 Dorsalgia, unspecified: Secondary | ICD-10-CM | POA: Insufficient documentation

## 2012-03-22 DIAGNOSIS — R350 Frequency of micturition: Secondary | ICD-10-CM | POA: Insufficient documentation

## 2012-03-22 DIAGNOSIS — R5381 Other malaise: Secondary | ICD-10-CM | POA: Insufficient documentation

## 2012-03-22 DIAGNOSIS — G8929 Other chronic pain: Secondary | ICD-10-CM

## 2012-03-22 DIAGNOSIS — N390 Urinary tract infection, site not specified: Secondary | ICD-10-CM

## 2012-03-22 DIAGNOSIS — R1031 Right lower quadrant pain: Secondary | ICD-10-CM | POA: Insufficient documentation

## 2012-03-22 DIAGNOSIS — R319 Hematuria, unspecified: Secondary | ICD-10-CM | POA: Insufficient documentation

## 2012-03-22 LAB — COMPREHENSIVE METABOLIC PANEL
AST: 19 U/L (ref 0–37)
BUN: 10 mg/dL (ref 6–23)
CO2: 25 mEq/L (ref 19–32)
Calcium: 9.2 mg/dL (ref 8.4–10.5)
Chloride: 104 mEq/L (ref 96–112)
Creatinine, Ser: 0.71 mg/dL (ref 0.50–1.10)
GFR calc Af Amer: >90 mL/min (ref >90)
GFR calc non Af Amer: >90 mL/min (ref >90)
Glucose, Bld: 97 mg/dL (ref 70–99)
Total Bilirubin: 0.2 mg/dL — ABNORMAL LOW (ref 0.3–1.2)

## 2012-03-22 LAB — CBC WITH DIFFERENTIAL/PLATELET
Basophils Absolute: 0.1 10*3/uL (ref 0.0–0.1)
Basophils Relative: 1 % (ref 0–1)
Eosinophils Relative: 3 % (ref 0–5)
HCT: 33.3 % — ABNORMAL LOW (ref 36.0–46.0)
Hemoglobin: 10.3 g/dL — ABNORMAL LOW (ref 12.0–15.0)
Lymphocytes Relative: 34 % (ref 12–46)
Lymphs Abs: 2.1 10*3/uL (ref 0.7–4.0)
MCV: 73 fL — ABNORMAL LOW (ref 78.0–100.0)
Monocytes Relative: 10 % (ref 3–12)
Neutro Abs: 3.3 10*3/uL (ref 1.7–7.7)
RDW: 17.2 % — ABNORMAL HIGH (ref 11.5–15.5)
WBC: 6.3 10*3/uL (ref 4.0–10.5)

## 2012-03-22 LAB — URINALYSIS, ROUTINE W REFLEX MICROSCOPIC
Glucose, UA: NEGATIVE mg/dL
Ketones, ur: NEGATIVE mg/dL
Protein, ur: NEGATIVE mg/dL
pH: 6.5 (ref 5.0–8.0)

## 2012-03-22 LAB — URINE MICROSCOPIC-ADD ON

## 2012-03-22 MED ORDER — HYDROCODONE-ACETAMINOPHEN 5-325 MG PO TABS: 2.0000 | ORAL_TABLET | Freq: Three times a day (TID) | ORAL | Status: AC | PRN

## 2012-03-22 MED ORDER — CEPHALEXIN 500 MG PO CAPS: ORAL_CAPSULE | ORAL | Status: AC

## 2012-03-22 NOTE — ED Notes (Signed)
[id number]<AV> Expected date:<BR> Expected time:<BR> Means of arrival:<BR> Comments:<BR> [place]

## 2012-03-22 NOTE — ED Provider Notes (Signed)
History     CSN: [id number]  Arrival date & time 03/22/12  0906   First MD Initiated Contact with Patient 03/22/12 [phone]      Chief Complaint  Patient presents with  . Abscess    (Consider location/radiation/quality/duration/timing/severity/associated sxs/prior treatment) The history is provided by the patient and medical records.   Wendy Floyd is a 31 y.o. female presents to the emergency department complaining of  "knot in her stomach".  The onset of the symptoms was  gradual starting 6 months ago.  The patient has associated nausea, frequent urination, back ache, fatigue, hematuria.  The symptoms have been  persistent, gradually worsened.  Nothing makes the symptoms worse and walking makes symptoms better.  The patient denies fever, chills, headache, chest pain, shortness of breath, dysuria.  She describes the pain as sharp and stabbing, rated at a 9/10 and located in the RLQ.  She states the site is swollen and tender to touch.  She denies erythema at the site.  No Hx of abscess.  No sick contacts.    The patient has medical history significant for:  Past Medical History  Diagnosis Date  . No pertinent past medical history      Past Surgical History  Procedure Date  . Cesarean section     History reviewed. No pertinent family history.  History  Substance Use Topics  . Smoking status: Current Everyday Smoker -- 0.5 packs/day  . Smokeless tobacco: Not on file  . Alcohol Use: Yes    OB History    Grav Para Term Preterm Abortions TAB SAB Ect Mult Living   4 4 4  0 0 0 0 0 0 4      Review of Systems  Constitutional: Positive for fatigue. Negative for fever, chills, diaphoresis, appetite change and unexpected weight change.  HENT: Negative for neck pain and neck stiffness.   Eyes: Negative for visual disturbance.  Respiratory: Negative for cough, chest tightness, shortness of breath, wheezing and stridor.   Cardiovascular: Negative for chest pain and palpitations.    Gastrointestinal: Positive for nausea and abdominal pain. Negative for vomiting, diarrhea, constipation, blood in stool, abdominal distention and rectal pain.  Genitourinary: Positive for frequency and hematuria. Negative for dysuria, urgency, flank pain, decreased urine volume and difficulty urinating.  Musculoskeletal: Positive for back pain.  Skin: Negative for rash.  Neurological: Positive for headaches. Negative for syncope, weakness and light-headedness.  Psychiatric/Behavioral: Negative for confusion and disturbed wake/sleep cycle. The patient is not nervous/anxious.   All other systems reviewed and are negative.    Allergies  Review of patient's allergies indicates no known allergies.  Home Medications  No current outpatient prescriptions on file.  BP 135/79  Pulse 72  Temp 97.6 F (36.4 C) (Oral)  Resp 16  Ht 5\' 8"  (1.727 m)  Wt 210 lb (95.255 kg)  BMI 31.93 kg/m2  SpO2 100%  LMP 02/07/2012  Physical Exam  Nursing note and vitals reviewed. Constitutional: She appears well-developed and well-nourished.  HENT:  Head: Normocephalic and atraumatic.  Mouth/Throat: Oropharynx is clear and moist.  Eyes: Conjunctivae are normal. No scleral icterus.  Cardiovascular: Normal rate, regular rhythm, normal heart sounds and intact distal pulses.  Exam reveals no gallop and no friction rub.   No murmur heard. Pulmonary/Chest: Effort normal and breath sounds normal. No respiratory distress. She has no wheezes.  Abdominal: Soft. Normal appearance and bowel sounds are normal. She exhibits no distension and no mass. There is tenderness (With deep palpation) in  the right lower quadrant and suprapubic area. There is guarding. There is no rebound, no CVA tenderness and negative Murphy's sign.       Unable to palpate the mass the patient was talking about. Small mobile nontender soft nodule palpated superficial adipose tissue, possibly a lipoma, patient states this is not the mass she is  talking about.  Neurological: She is alert.  Skin: Skin is warm and dry. No rash noted.  Psychiatric: She has a normal mood and affect.    ED Course  Procedures (including critical care time)  Labs Reviewed  URINALYSIS, ROUTINE W REFLEX MICROSCOPIC - Abnormal; Notable for the following:    APPearance CLOUDY (*)     Hgb urine dipstick TRACE (*)     Leukocytes, UA TRACE (*)     All other components within normal limits  CBC WITH DIFFERENTIAL - Abnormal; Notable for the following:    Hemoglobin 10.3 (*)     HCT 33.3 (*)     MCV 73.0 (*)     MCH 22.6 (*)     RDW 17.2 (*)     All other components within normal limits  COMPREHENSIVE METABOLIC PANEL - Abnormal; Notable for the following:    Total Bilirubin 0.2 (*)     All other components within normal limits  URINE MICROSCOPIC-ADD ON - Abnormal; Notable for the following:    Squamous Epithelial / LPF FEW (*)     Bacteria, UA FEW (*)     All other components within normal limits   No results found.  Results for orders placed during the hospital encounter of 03/22/12  URINALYSIS, ROUTINE W REFLEX MICROSCOPIC      Component Value Range   Color, Urine YELLOW  YELLOW   APPearance CLOUDY (*) CLEAR   Specific Gravity, Urine 1.029  1.005 - 1.030   pH 6.5  5.0 - 8.0   Glucose, UA NEGATIVE  NEGATIVE mg/dL   Hgb urine dipstick TRACE (*) NEGATIVE   Bilirubin Urine NEGATIVE  NEGATIVE   Ketones, ur NEGATIVE  NEGATIVE mg/dL   Protein, ur NEGATIVE  NEGATIVE mg/dL   Urobilinogen, UA 0.2  0.0 - 1.0 mg/dL   Nitrite NEGATIVE  NEGATIVE   Leukocytes, UA TRACE (*) NEGATIVE  CBC WITH DIFFERENTIAL      Component Value Range   WBC 6.3  4.0 - 10.5 K/uL   RBC 4.56  3.87 - 5.11 MIL/uL   Hemoglobin 10.3 (*) 12.0 - 15.0 g/dL   HCT 16.1 (*) 09.6 - 04.5 %   MCV 73.0 (*) 78.0 - 100.0 fL   MCH 22.6 (*) 26.0 - 34.0 pg   MCHC 30.9  30.0 - 36.0 g/dL   RDW 40.9 (*) 81.1 - 91.4 %   Platelets 347  150 - 400 K/uL   Neutrophils Relative 52  43 - 77 %    Lymphocytes Relative 34  12 - 46 %   Monocytes Relative 10  3 - 12 %   Eosinophils Relative 3  0 - 5 %   Basophils Relative 1  0 - 1 %   Neutro Abs 3.3  1.7 - 7.7 K/uL   Lymphs Abs 2.1  0.7 - 4.0 K/uL   Monocytes Absolute 0.6  0.1 - 1.0 K/uL   Eosinophils Absolute 0.2  0.0 - 0.7 K/uL   Basophils Absolute 0.1  0.0 - 0.1 K/uL  COMPREHENSIVE METABOLIC PANEL      Component Value Range   Sodium 138  135 - 145 mEq/L  Potassium 3.9  3.5 - 5.1 mEq/L   Chloride 104  96 - 112 mEq/L   CO2 25  19 - 32 mEq/L   Glucose, Bld 97  70 - 99 mg/dL   BUN 10  6 - 23 mg/dL   Creatinine, Ser 4.54  0.50 - 1.10 mg/dL   Calcium 9.2  8.4 - 09.8 mg/dL   Total Protein 7.1  6.0 - 8.3 g/dL   Albumin 3.8  3.5 - 5.2 g/dL   AST 19  0 - 37 U/L   ALT 15  0 - 35 U/L   Alkaline Phosphatase 69  39 - 117 U/L   Total Bilirubin 0.2 (*) 0.3 - 1.2 mg/dL   GFR calc non Af Amer >90  >90 mL/min   GFR calc Af Amer >90  >90 mL/min  URINE MICROSCOPIC-ADD ON      Component Value Range   Squamous Epithelial / LPF FEW (*) RARE   WBC, UA 3-6  <3 WBC/hpf   RBC / HPF 3-6  <3 RBC/hpf   Bacteria, UA FEW (*) RARE   Urine-Other MUCOUS PRESENT     No results found.    1. Abdominal pain   2. Hematuria       MDM  Wendy Floyd presents with 6 months of abdominal pain and a "knot in her stomach."  Your palpate a mass on exam however she is tender to palpation in the right lower quadrant and suprapubic area. She endorses urinary symptoms therefore a I will obtain a urinalysis. I also started basic labs and will have her moved to the major side for further right evaluation and possible imaging.  CBC without leukocytosis mild anemia at 10.3.  Urine microscopic was few bacteria but appears contaminated.  CMP within normal limits.  Patient discussed with Dr. Fonnie Jarvis who will assume care.        Dahlia Client Cephus Tupy, PA-C 03/22/12 1505

## 2012-03-22 NOTE — ED Notes (Signed)
Patient states she's had a knot on her pelvic area for 6 months and it's been bothering her more lately.

## 2012-03-22 NOTE — ED Notes (Signed)
Care of pt assumed. Pt reports "knot" in lower R abd x66mo. Not visible. Worse with palpation. Also reports frequent urination, "30x a day."

## 2012-03-22 NOTE — ED Provider Notes (Signed)
Medical screening examination/treatment/procedure(s) were conducted as a shared visit with non-physician practitioner(s) and myself.  I personally evaluated the patient during the encounter  This 31 year old female has chronic right lower quadrant pelvic pain for over a year with multiple evaluations in the past by primary care doctors as well as GYN doctors with multiple pelvic ultrasounds in the past as well. She has chronic severe pain 24 hours a day for well over a year and has been on multiple narcotics and other pain medicines as well. She states she has not seen a doctor for this in several months. She is no vaginal discharge no vaginal bleeding but does have some urinary frequency recently. She is no vomiting or diarrhea. She is no rash. This pain is worse with position changes.  An examination of the abdomen she has isolated right lower pelvic tenderness which is minimal with no peritoneal signs, chaperone was present for bimanual examination with minimal right adnexal tenderness without palpable mass, she has no cervical motion tenderness, the rest of her pelvic exam is nontender, she is no vaginal discharge or bleeding on the examination glove. HCG pnd.  Doubt PID, torsion.  Hurman Horn, MD 03/22/12 [phone]

## 2012-03-22 NOTE — ED Notes (Signed)
Patient denies fevers.  Patient states she doesn't have insurance or a PCP.

## 2012-03-22 NOTE — Progress Notes (Signed)
Pt without pcp She was seen by [facility] coordinator and offered pcp resources for self pay patients Pt initially did not want resources She informed [facility] staff she was "having major surgery"  Pt did finally accept written resources

## 2012-04-19 ENCOUNTER — Ambulatory Visit: Payer: Self-pay | Admitting: Obstetrics and Gynecology

## 2012-05-22 ENCOUNTER — Encounter (HOSPITAL_COMMUNITY): Payer: Self-pay | Admitting: Emergency Medicine

## 2012-05-22 ENCOUNTER — Emergency Department (HOSPITAL_COMMUNITY)
Admission: EM | Admit: 2012-05-22 | Discharge: 2012-05-22 | Disposition: A | Discharge status: Home / Self Care | Payer: Self-pay | Attending: Emergency Medicine | Admitting: Emergency Medicine

## 2012-05-22 DIAGNOSIS — IMO0002 Reserved for concepts with insufficient information to code with codable children: Secondary | ICD-10-CM | POA: Insufficient documentation

## 2012-05-22 DIAGNOSIS — F17200 Nicotine dependence, unspecified, uncomplicated: Secondary | ICD-10-CM | POA: Insufficient documentation

## 2012-05-22 DIAGNOSIS — M5410 Radiculopathy, site unspecified: Secondary | ICD-10-CM

## 2012-05-22 DIAGNOSIS — R350 Frequency of micturition: Secondary | ICD-10-CM | POA: Insufficient documentation

## 2012-05-22 DIAGNOSIS — M79609 Pain in unspecified limb: Secondary | ICD-10-CM | POA: Insufficient documentation

## 2012-05-22 LAB — URINALYSIS, ROUTINE W REFLEX MICROSCOPIC
Glucose, UA: NEGATIVE mg/dL
Leukocytes, UA: NEGATIVE
Protein, ur: NEGATIVE mg/dL
Specific Gravity, Urine: 1.019 (ref 1.005–1.030)
Urobilinogen, UA: 0.2 mg/dL (ref 0.0–1.0)

## 2012-05-22 LAB — URINE MICROSCOPIC-ADD ON

## 2012-05-22 LAB — POCT I-STAT, CHEM 8
BUN: 6 mg/dL (ref 6–23)
Calcium, Ion: 1.25 mmol/L — ABNORMAL HIGH (ref 1.12–1.23)
Chloride: 106 mEq/L (ref 96–112)
HCT: 37 % (ref 36.0–46.0)
Sodium: 139 mEq/L (ref 135–145)
TCO2: 22 mmol/L (ref 0–100)

## 2012-05-22 LAB — PREGNANCY, URINE: Preg Test, Ur: NEGATIVE

## 2012-05-22 MED ORDER — IBUPROFEN 800 MG PO TABS
800.0000 mg | ORAL_TABLET | Freq: Once | ORAL | Status: AC
  Administered 2012-05-22: 800 mg via ORAL
  Filled 2012-05-22: qty 1

## 2012-05-22 NOTE — ED Notes (Signed)
Pt states that she has had shooting pains that start in her lower back and go down to her ankle.  Also states that she has been urinating more frequently.  Pt also c/o cough x 1 day.

## 2012-05-22 NOTE — ED Provider Notes (Signed)
History  This chart was scribed for non-physician practitioner working with Tobin Chad, MD by Shari Heritage. This patient was seen in room WTR8/WTR8 and the patient's care was started at 1337.   CSN: [id number]  Arrival date & time 05/22/12  1149   First MD Initiated Contact with Patient 05/22/12 1337      Chief Complaint  Patient presents with  . Hip Pain  . Leg Pain     Patient is a 31 y.o. female presenting with leg pain and hip pain. The history is provided by the patient. No language interpreter was used.  Leg Pain  The incident occurred more than 1 week ago. There was no injury mechanism. The pain is present in the left hip, left thigh and left leg. The quality of the pain is described as aching. The pain is moderate. The pain has been constant since onset. Associated symptoms include numbness. She reports no foreign bodies present. Treatments tried: massage. The treatment provided no relief.  Hip Pain This is a recurrent problem. The current episode started more than 1 week ago. The problem occurs constantly. Nothing aggravates the symptoms. Nothing relieves the symptoms. She has tried nothing for the symptoms.    HPI Comments: Wendy Floyd is a 31 y.o. female who presents to the Emergency Department complaining of constant, moderate, achy left hip pain that radiates down the leg onset 1 month ago. There is associated numbness in the leg. Patient also states she is experiencing polyuria. She is ambulatory. Patient states that she hasn't tried any medications at home. She says that she has tried massaging the area with no relief. Patient is a current every day smoker. Patient also says that drinks a lot of alcohol, but did not specify how much per day or week. Denies excessive thirst, fatigue, or weight changes.  Last menstrual period was 1 month ago.  Past Medical History  Diagnosis Date  . No pertinent past medical history     Past Surgical History  Procedure Date  .  Cesarean section     History reviewed. No pertinent family history.  History  Substance Use Topics  . Smoking status: Current Every Day Smoker -- 0.5 packs/day  . Smokeless tobacco: Not on file  . Alcohol Use: Yes    OB History    Grav Para Term Preterm Abortions TAB SAB Ect Mult Living   4 4 4  0 0 0 0 0 0 4      Review of Systems  Constitutional: Negative.   HENT: Negative.   Eyes: Negative.   Respiratory: Negative.   Cardiovascular: Negative.   Gastrointestinal: Negative.   Genitourinary: Positive for frequency.  Musculoskeletal: Positive for arthralgias.  Skin: Negative.   Neurological: Positive for numbness.    Allergies  Review of patient's allergies indicates no known allergies.  Home Medications   Current Outpatient Rx  Name Route Sig Dispense Refill  . NYQUIL MULTI-SYMPTOM PO Oral Take 15 mLs by mouth once.      BP 118/69  Pulse 62  Temp 98.8 F (37.1 C) (Oral)  Resp 16  SpO2 100%  LMP 04/22/2012  Physical Exam  Nursing note and vitals reviewed. Constitutional: She is oriented to person, place, and time. She appears well-developed and well-nourished.  HENT:  Head: Normocephalic and atraumatic.  Cardiovascular: Normal rate.   Pulmonary/Chest: Effort normal.  Abdominal: Soft. There is no CVA tenderness.       No CVA tenderness.  Musculoskeletal:  Left hip: She exhibits normal range of motion.       Normal hip flexion and extension bilaterally.  No abscess, no hernia noted to left hip area.  Neurological: She is alert and oriented to person, place, and time.  Skin: Skin is warm and dry.       No skin changes.  Psychiatric: She has a normal mood and affect. Her behavior is normal.    ED Course  Procedures (including critical care time) DIAGNOSTIC STUDIES: Oxygen Saturation is 100% on room air, normal by my interpretation.    COORDINATION OF CARE: 1:46pm- Patient informed of current plan for treatment and evaluation and agrees with  plan at this time.   3:20pm- After patient expressed concern over urinary frequency symptoms, ordered UA, I-stat and pregnancy test. States that she is improved and ready for discharge.  Results for orders placed during the hospital encounter of 05/22/12  URINALYSIS, ROUTINE W REFLEX MICROSCOPIC      Component Value Range   Color, Urine YELLOW  YELLOW   APPearance CLEAR  CLEAR   Specific Gravity, Urine 1.019  1.005 - 1.030   pH 6.5  5.0 - 8.0   Glucose, UA NEGATIVE  NEGATIVE mg/dL   Hgb urine dipstick TRACE (*) NEGATIVE   Bilirubin Urine NEGATIVE  NEGATIVE   Ketones, ur NEGATIVE  NEGATIVE mg/dL   Protein, ur NEGATIVE  NEGATIVE mg/dL   Urobilinogen, UA 0.2  0.0 - 1.0 mg/dL   Nitrite NEGATIVE  NEGATIVE   Leukocytes, UA NEGATIVE  NEGATIVE  PREGNANCY, URINE      Component Value Range   Preg Test, Ur NEGATIVE  NEGATIVE  POCT I-STAT, CHEM 8      Component Value Range   Sodium 139  135 - 145 mEq/L   Potassium 3.8  3.5 - 5.1 mEq/L   Chloride 106  96 - 112 mEq/L   BUN 6  6 - 23 mg/dL   Creatinine, Ser 7.82  0.50 - 1.10 mg/dL   Glucose, Bld 85  70 - 99 mg/dL   Calcium, Ion 9.56 (*) 1.12 - 1.23 mmol/L   TCO2 22  0 - 100 mmol/L   Hemoglobin 12.6  12.0 - 15.0 g/dL   HCT 21.3  08.6 - 57.8 %  URINE MICROSCOPIC-ADD ON      Component Value Range   Squamous Epithelial / LPF FEW (*) RARE   WBC, UA 0-2  <3 WBC/hpf   RBC / HPF 0-2  <3 RBC/hpf   Bacteria, UA RARE  RARE   Urine-Other MUCOUS PRESENT      No results found.   No diagnosis found.  1. L hip pain 2. Urinary frequency  MDM    Pt reports persistent L hip pain radiates down to L leg x 1 month.  Has not tried any treatment yet.  No significant finding on exam, no signs of infection.  Able to ambulate without difficulty.   Pt also reports she urinates 20-30 times daily.  No c/o abd pain, or vaginal discharge.  LMP 1 month ago.   Pt is afebrile , VSS.  Therefore, i recommend pt to tried OTC medication and will give resources  for further follow up.  Also offer to check UA for her urinary complaints.    2:05 PM Pt became irate because she wants extensive testing which i do not felt warranted during this visit.  NO indication for narcotic pain meds.  Pt request to talk to supervisor because she felt she has  to wait too long before being seen.  Pt has been here for 2 hrs.  I notified nursing staff to address pt's need.  Will check UA, pregnancy test, and Istat8.  I have also discussed with my attending, who will reevaluate this patient.    3:34 PM Pt stable to be discharged.  All concerns has been addressed.    I personally performed the services described in this documentation, which was scribed in my presence. The recorded information has been reviewed and considered.  BP 118/69  Pulse 62  Temp 98.8 F (37.1 C) (Oral)  Resp 16  SpO2 100%  LMP 04/22/2012     Fayrene Helper, PA-C 05/22/12 1534  Fayrene Helper, PA-C 05/22/12 1539

## 2012-05-22 NOTE — ED Provider Notes (Signed)
Medical screening examination/treatment/procedure(s) were performed by non-physician practitioner and as supervising physician I was immediately available for consultation/collaboration.  Tobin Chad, MD 05/22/12 [phone]

## 2012-05-22 NOTE — ED Notes (Signed)
Pt not in treatment room. PA stated that he reviewed labs with her and was going to provide resources

## 2012-05-23 ENCOUNTER — Inpatient Hospital Stay (HOSPITAL_COMMUNITY)
Admission: AD | Admit: 2012-05-23 | Discharge: 2012-05-23 | Disposition: A | Discharge status: Home / Self Care | Payer: Self-pay | Source: Ambulatory Visit | Attending: Obstetrics and Gynecology | Admitting: Obstetrics and Gynecology

## 2012-05-23 ENCOUNTER — Encounter (HOSPITAL_COMMUNITY): Payer: Self-pay

## 2012-05-23 DIAGNOSIS — B9689 Other specified bacterial agents as the cause of diseases classified elsewhere: Secondary | ICD-10-CM | POA: Insufficient documentation

## 2012-05-23 DIAGNOSIS — M79609 Pain in unspecified limb: Secondary | ICD-10-CM | POA: Insufficient documentation

## 2012-05-23 DIAGNOSIS — N760 Acute vaginitis: Secondary | ICD-10-CM | POA: Insufficient documentation

## 2012-05-23 DIAGNOSIS — A499 Bacterial infection, unspecified: Secondary | ICD-10-CM | POA: Insufficient documentation

## 2012-05-23 DIAGNOSIS — R109 Unspecified abdominal pain: Secondary | ICD-10-CM | POA: Insufficient documentation

## 2012-05-23 HISTORY — DX: Endometriosis, unspecified: N80.9

## 2012-05-23 HISTORY — DX: Benign neoplasm of connective and other soft tissue, unspecified: D21.9

## 2012-05-23 HISTORY — DX: Anemia, unspecified: D64.9

## 2012-05-23 LAB — URINALYSIS, ROUTINE W REFLEX MICROSCOPIC
Bilirubin Urine: NEGATIVE
Glucose, UA: NEGATIVE mg/dL
Ketones, ur: NEGATIVE mg/dL
Specific Gravity, Urine: 1.015 (ref 1.005–1.030)
pH: 7 (ref 5.0–8.0)

## 2012-05-23 LAB — WET PREP, GENITAL: Trich, Wet Prep: NONE SEEN

## 2012-05-23 LAB — URINE MICROSCOPIC-ADD ON

## 2012-05-23 LAB — POCT PREGNANCY, URINE: Preg Test, Ur: NEGATIVE

## 2012-05-23 MED ORDER — KETOROLAC TROMETHAMINE 60 MG/2ML IM SOLN
60.0000 mg | Freq: Once | INTRAMUSCULAR | Status: AC
  Administered 2012-05-23: 60 mg via INTRAMUSCULAR
  Filled 2012-05-23: qty 2

## 2012-05-23 MED ORDER — METRONIDAZOLE 500 MG PO TABS: 500.0000 mg | ORAL_TABLET | Freq: Two times a day (BID) | ORAL | Status: DC

## 2012-05-23 NOTE — MAU Note (Signed)
Pt states lower abd pain on left side only since Sunday. Denies abnormal vaginal discharge or bleeding. Pain is constant.

## 2012-05-23 NOTE — MAU Provider Note (Signed)
Attestation of Attending Supervision of Advanced Practitioner (CNM/NP): Evaluation and management procedures were performed by the Advanced Practitioner under my supervision and collaboration.  I have reviewed the Advanced Practitioner's note and chart, and I agree with the management and plan.  Zamiah Tollett 05/23/2012 4:40 PM

## 2012-05-23 NOTE — MAU Provider Note (Signed)
History     CSN: [id number]  Arrival date and time: 05/23/12 0810   First Provider Initiated Contact with Patient 05/23/12 [phone]      Chief Complaint  Patient presents with  . Abdominal Pain  . Leg Pain   HPI Wendy Floyd is 31 y.o. [phone]  presenting with lower abdominal pain that "goes all across" that localized to the left.  This pain began 5 days ago. ? Knot and the pain seems to be around the hips.  Urinates frequently, denies dysuria or hematuria.   LMP10/5/13. Has mucous that is red several days after her period is over.  Has discomfort with every menstrual cycle.  Has mood changes with her period.   She states she has an appointment in our GYN CLINIC for the end of the month.   Using no contraception.  Pregnancy would ok.  Married-1 partner.  She was seen yesterday in [facility] and was given Motrin 800mg  that helped "for a little while".  Hx of Endometriosis, had surgery 2009 ([facility])   Does not have a doctor at this time.   Past Medical History  Diagnosis Date  . Endometriosis   . Fibroids   . Anemia     Past Surgical History  Procedure Date  . Cesarean section   . Endometrial ablation     ? Pt unsure    No family history on file.  History  Substance Use Topics  . Smoking status: Current Every Day Smoker -- 0.5 packs/day  . Smokeless tobacco: Not on file  . Alcohol Use: Yes    Allergies: No Known Allergies  No prescriptions prior to admission    Review of Systems  Constitutional: Negative.   HENT: Negative.   Respiratory: Negative.   Cardiovascular: Negative.   Gastrointestinal: Abdominal pain: and groin pain ?knot on the right.  Genitourinary: Positive for frequency.  Neurological: Negative.    Physical Exam   Blood pressure 122/80, pulse 69, temperature 97.8 F (36.6 C), temperature source Oral, resp. rate 16, height 5\' 9"  (1.753 m), weight 93.498 kg (206 lb 2 oz), last menstrual period 05/04/2012.  Physical Exam  Constitutional: She is oriented  to person, place, and time. She appears well-developed and well-nourished. No distress.  HENT:  Head: Normocephalic.  Neck: Normal range of motion.  Cardiovascular: Normal rate.   Respiratory: Effort normal.  GI: Soft. She exhibits no distension and no mass. There is tenderness (mild right lower abdominal tenderness over incision site.  Neg for lymphadenopathy bialterally). There is no rebound and no guarding.  Genitourinary: There is no rash, tenderness or lesion on the right labia. There is no rash, tenderness or lesion on the left labia. Uterus is enlarged (upper limit of normal). Uterus is not tender. Right adnexum displays no mass, no tenderness and no fullness. Left adnexum displays no mass, no tenderness and no fullness. No bleeding around the vagina. Vaginal discharge (mod amount of creamy white discharge with odor) found.  Neurological: She is alert and oriented to person, place, and time.  Skin: Skin is warm and dry.  Psychiatric: She has a normal mood and affect. Her behavior is normal.   Results for orders placed during the hospital encounter of 05/23/12 (from the past 24 hour(s))  URINALYSIS, ROUTINE W REFLEX MICROSCOPIC     Status: Abnormal   Collection Time   05/23/12  8:21 AM      Component Value Range   Color, Urine YELLOW  YELLOW   APPearance CLEAR  CLEAR  Specific Gravity, Urine 1.015  1.005 - 1.030   pH 7.0  5.0 - 8.0   Glucose, UA NEGATIVE  NEGATIVE mg/dL   Hgb urine dipstick TRACE (*) NEGATIVE   Bilirubin Urine NEGATIVE  NEGATIVE   Ketones, ur NEGATIVE  NEGATIVE mg/dL   Protein, ur NEGATIVE  NEGATIVE mg/dL   Urobilinogen, UA 0.2  0.0 - 1.0 mg/dL   Nitrite NEGATIVE  NEGATIVE   Leukocytes, UA NEGATIVE  NEGATIVE  URINE MICROSCOPIC-ADD ON     Status: Abnormal   Collection Time   05/23/12  8:21 AM      Component Value Range   Squamous Epithelial / LPF FEW (*) RARE   WBC, UA 0-2  <3 WBC/hpf   RBC / HPF 3-6  <3 RBC/hpf  WET PREP, GENITAL     Status: Abnormal    Collection Time   05/23/12  9:15 AM      Component Value Range   Yeast Wet Prep HPF POC NONE SEEN  NONE SEEN   Trich, Wet Prep NONE SEEN  NONE SEEN   Clue Cells Wet Prep HPF POC MODERATE (*) NONE SEEN   WBC, Wet Prep HPF POC FEW (*) NONE SEEN   MAU Course  Procedures  MDM  Toradol 60mg  IM for pain ordered Assessment and Plan  A:  Lower abdominal pain      Bacterial vaginosis  P:  Rx for Flagyl 500mg  bid X 1 week      Keep appt in GYN CLINIC       Instructed not to take additional Ibuprofen or aleve until late tonight.         Terrika Zuver,EVE M 05/23/2012, 9:04 AM

## 2012-05-23 NOTE — MAU Note (Signed)
Pt states left sided pelvic pain radiates down her left leg.

## 2012-05-24 LAB — GC/CHLAMYDIA PROBE AMP, GENITAL
Chlamydia, DNA Probe: NEGATIVE
GC Probe Amp, Genital: NEGATIVE

## 2013-02-19 ENCOUNTER — Ambulatory Visit: Payer: Self-pay | Admitting: Obstetrics & Gynecology

## 2013-02-24 ENCOUNTER — Inpatient Hospital Stay (HOSPITAL_COMMUNITY): Payer: Self-pay

## 2013-02-24 ENCOUNTER — Encounter (HOSPITAL_COMMUNITY): Payer: Self-pay | Admitting: *Deleted

## 2013-02-24 ENCOUNTER — Inpatient Hospital Stay (HOSPITAL_COMMUNITY)
Admission: AD | Admit: 2013-02-24 | Discharge: 2013-02-24 | Disposition: A | Discharge status: Home / Self Care | Payer: Self-pay | Source: Ambulatory Visit | Attending: Family Medicine | Admitting: Family Medicine

## 2013-02-24 DIAGNOSIS — R1032 Left lower quadrant pain: Secondary | ICD-10-CM | POA: Insufficient documentation

## 2013-02-24 DIAGNOSIS — Z8742 Personal history of other diseases of the female genital tract: Secondary | ICD-10-CM | POA: Insufficient documentation

## 2013-02-24 DIAGNOSIS — R110 Nausea: Secondary | ICD-10-CM | POA: Insufficient documentation

## 2013-02-24 DIAGNOSIS — R109 Unspecified abdominal pain: Secondary | ICD-10-CM

## 2013-02-24 DIAGNOSIS — R197 Diarrhea, unspecified: Secondary | ICD-10-CM | POA: Insufficient documentation

## 2013-02-24 DIAGNOSIS — N946 Dysmenorrhea, unspecified: Secondary | ICD-10-CM | POA: Insufficient documentation

## 2013-02-24 LAB — URINE MICROSCOPIC-ADD ON

## 2013-02-24 LAB — URINALYSIS, ROUTINE W REFLEX MICROSCOPIC
Bilirubin Urine: NEGATIVE
Glucose, UA: NEGATIVE mg/dL
Nitrite: NEGATIVE
Specific Gravity, Urine: 1.02 (ref 1.005–1.030)
pH: 7 (ref 5.0–8.0)

## 2013-02-24 LAB — WET PREP, GENITAL: Trich, Wet Prep: NONE SEEN

## 2013-02-24 MED ORDER — IBUPROFEN 800 MG PO TABS
800.0000 mg | ORAL_TABLET | Freq: Once | ORAL | Status: AC
  Administered 2013-02-24: 800 mg via ORAL
  Filled 2013-02-24: qty 1

## 2013-02-24 NOTE — MAU Note (Signed)
Patient is in with c/o a year long of abdominal pain that started with a knot like bump on her rlq that have have moved to llq which radiates to her hips. She thinks it is affecting her bowels, she is having loose stools and voids "55 times a day". She also c/o white thick vaginal discharge. lmp 02/05/13. Patient states that she have to smoke marijuana daily due to the pain.

## 2013-02-24 NOTE — MAU Provider Note (Signed)
History     CSN: [id number]  Arrival date and time: 02/24/13 4098   First Provider Initiated Contact with Patient 02/24/13 1017      Chief Complaint  Patient presents with  . Abdominal Pain  . Nausea  . Diarrhea   HPI [name] is a 32 y.o. [phone] who presents to MAU today with pelvic pain worse in the LLQ. She rates her pain at 8/10 now while she appears very comfortable and is playing on her phone. She states that she has increased urgency and frequency of urination. She denies dysuria, fever, vaginal bleeding or discharge.   OB History   Grav Para Term Preterm Abortions TAB SAB Ect Mult Living   4 4 4  0 0 0 0 0 0 4      Past Medical History  Diagnosis Date  . Endometriosis   . Fibroids   . Anemia     Past Surgical History  Procedure Laterality Date  . Cesarean section      Family History  Problem Relation Age of Onset  . Diabetes Mother   . Hypertension Mother   . Diabetes Father   . Hearing loss Paternal Grandmother     History  Substance Use Topics  . Smoking status: Current Every Day Smoker -- 0.50 packs/day  . Smokeless tobacco: Not on file  . Alcohol Use: Yes    Allergies: No Known Allergies  Prescriptions prior to admission  Medication Sig Dispense Refill  . ibuprofen (ADVIL,MOTRIN) 200 MG tablet Take 400 mg by mouth every 6 (six) hours as needed for pain.        Review of Systems  Constitutional: Negative for fever and malaise/fatigue.  Gastrointestinal: Positive for nausea, vomiting, abdominal pain, diarrhea and constipation.  Genitourinary: Positive for urgency and frequency. Negative for dysuria.       Neg - vaginal bleeding, discharge   Physical Exam   Blood pressure 138/87, pulse 63, temperature 97.7 F (36.5 C), temperature source Oral, resp. rate 18, height 5\' 8"  (1.727 m), weight 223 lb (101.152 kg), last menstrual period 02/05/2013, SpO2 100.00%.  Physical Exam  Constitutional: She is oriented to person, place, and  time. She appears well-developed and well-nourished. No distress.  HENT:  Head: Normocephalic and atraumatic.  Cardiovascular: Normal rate, regular rhythm and normal heart sounds.   Respiratory: Effort normal and breath sounds normal. No respiratory distress.  GI: Soft. Bowel sounds are normal. She exhibits no distension and no mass. There is tenderness (mild tenderness to palpation of the lower abdomen). There is no rebound and no guarding.  Genitourinary: Uterus is not enlarged and not tender. Cervix exhibits no motion tenderness, no discharge and no friability. Right adnexum displays no mass and no tenderness. Left adnexum displays no mass and no tenderness. No bleeding around the vagina. Vaginal discharge (small amount of thin, white discharge noted with odor) found.  Neurological: She is alert and oriented to person, place, and time.  Skin: Skin is warm and dry. No erythema.  Psychiatric: She has a normal mood and affect.   Results for orders placed during the hospital encounter of 02/24/13 (from the past 24 hour(s))  URINALYSIS, ROUTINE W REFLEX MICROSCOPIC     Status: Abnormal   Collection Time    02/24/13  9:35 AM      Result Value Range   Color, Urine YELLOW  YELLOW   APPearance CLEAR  CLEAR   Specific Gravity, Urine 1.020  1.005 - 1.030   pH 7.0  5.0 - 8.0   Glucose, UA NEGATIVE  NEGATIVE mg/dL   Hgb urine dipstick TRACE (*) NEGATIVE   Bilirubin Urine NEGATIVE  NEGATIVE   Ketones, ur NEGATIVE  NEGATIVE mg/dL   Protein, ur NEGATIVE  NEGATIVE mg/dL   Urobilinogen, UA 0.2  0.0 - 1.0 mg/dL   Nitrite NEGATIVE  NEGATIVE   Leukocytes, UA NEGATIVE  NEGATIVE  URINE MICROSCOPIC-ADD ON     Status: Abnormal   Collection Time    02/24/13  9:35 AM      Result Value Range   Squamous Epithelial / LPF FEW (*) RARE   RBC / HPF 0-2  <3 RBC/hpf   Bacteria, UA RARE  RARE  POCT PREGNANCY, URINE     Status: None   Collection Time    02/24/13  9:54 AM      Result Value Range   Preg Test, Ur  NEGATIVE  NEGATIVE  WET PREP, GENITAL     Status: Abnormal   Collection Time    02/24/13 10:30 AM      Result Value Range   Yeast Wet Prep HPF POC NONE SEEN  NONE SEEN   Trich, Wet Prep NONE SEEN  NONE SEEN   Clue Cells Wet Prep HPF POC NONE SEEN  NONE SEEN   WBC, Wet Prep HPF POC FEW (*) NONE SEEN   US Transvaginal Non-ob  02/24/2013   *RADIOLOGY REPORT*  Clinical Data: Right lower quadrant pain and dysmenorrhea.  G4 P4 with 4 prior C-sections.  History of endometriosis.  LMP 02/15/2013.  TRANSABDOMINAL AND TRANSVAGINAL ULTRASOUND OF PELVIS Technique:  Both transabdominal and transvaginal ultrasound examinations of the pelvis were performed. Transabdominal technique was performed for global imaging of the pelvis including uterus, ovaries, adnexal regions, and pelvic cul-de-sac.  It was necessary to proceed with endovaginal exam following the transabdominal exam to visualize the ovaries and endometrium.  Comparison:  12/05/2011  Findings:  Uterus: The uterus is anteverted and measures 9.9 x 4.6 x 6.4 cm. No focal uterine mass is identified.  Endometrium: Normal in thickness and appearance.  Measures 7 mm.  Right ovary:   Measures 3.4 x 3.0 x 2.8 cm, and contains multiple predominately peripherally oriented follicles.  No cyst or mass.  Left ovary: Measures 3.4 x 3.7 x 3.5 cm and contains a dominant 2.1 cm follicle.  No cyst or mass.  Other findings: Trace amount of free pelvic fluid.  IMPRESSION:  No evidence of pelvic mass or other significant abnormality.   Original Report Authenticated By: Britta Mccreedy, M.D.   US Pelvis Complete  02/24/2013   *RADIOLOGY REPORT*  Clinical Data: Right lower quadrant pain and dysmenorrhea.  G4 P4 with 4 prior C-sections.  History of endometriosis.  LMP 02/15/2013.  TRANSABDOMINAL AND TRANSVAGINAL ULTRASOUND OF PELVIS Technique:  Both transabdominal and transvaginal ultrasound examinations of the pelvis were performed. Transabdominal technique was performed for global  imaging of the pelvis including uterus, ovaries, adnexal regions, and pelvic cul-de-sac.  It was necessary to proceed with endovaginal exam following the transabdominal exam to visualize the ovaries and endometrium.  Comparison:  12/05/2011  Findings:  Uterus: The uterus is anteverted and measures 9.9 x 4.6 x 6.4 cm. No focal uterine mass is identified.  Endometrium: Normal in thickness and appearance.  Measures 7 mm.  Right ovary:   Measures 3.4 x 3.0 x 2.8 cm, and contains multiple predominately peripherally oriented follicles.  No cyst or mass.  Left ovary: Measures 3.4 x 3.7 x 3.5 cm and contains  a dominant 2.1 cm follicle.  No cyst or mass.  Other findings: Trace amount of free pelvic fluid.  IMPRESSION:  No evidence of pelvic mass or other significant abnormality.   Original Report Authenticated By: Britta Mccreedy, M.D.    MAU Course  Procedures None  MDM UPT - negative UA, Wet prep, GC/Chlamydia and [place] today 800 mg Ibuprofen given in MAU No evidence of infection in bladder or vagina. Normal pelvic US. Patient instructed to follow-up with PCP for referral to GI and possibly urology for further management of symptoms.   Assessment and Plan  A: Abdominal pain, most likely GI origin, possible IBS  P: Discharge home Patient advised to follow-up with PCP for GI and possible Urology referral Patient advised to manage symptoms with stool softener and imodium as needed. Ibuprofen PRN for pain Patient may return to MAU as needed, however she was advised that consults with specialists are better obtained at [facility] or [facility] in the future  Freddi Starr, PA-C  02/24/2013, 12:04 PM

## 2013-02-24 NOTE — MAU Provider Note (Signed)
Chart reviewed and agree with management and plan.  

## 2013-02-25 LAB — GC/CHLAMYDIA PROBE AMP
CT Probe RNA: NEGATIVE
GC Probe RNA: NEGATIVE

## 2013-03-13 ENCOUNTER — Emergency Department (HOSPITAL_COMMUNITY)
Admission: EM | Admit: 2013-03-13 | Discharge: 2013-03-13 | Disposition: A | Discharge status: Home / Self Care | Payer: Self-pay | Attending: Emergency Medicine | Admitting: Emergency Medicine

## 2013-03-13 ENCOUNTER — Encounter (HOSPITAL_COMMUNITY): Payer: Self-pay | Admitting: Emergency Medicine

## 2013-03-13 DIAGNOSIS — R110 Nausea: Secondary | ICD-10-CM | POA: Insufficient documentation

## 2013-03-13 DIAGNOSIS — R519 Headache, unspecified: Secondary | ICD-10-CM

## 2013-03-13 DIAGNOSIS — I10 Essential (primary) hypertension: Secondary | ICD-10-CM | POA: Insufficient documentation

## 2013-03-13 DIAGNOSIS — Z8742 Personal history of other diseases of the female genital tract: Secondary | ICD-10-CM | POA: Insufficient documentation

## 2013-03-13 DIAGNOSIS — G43909 Migraine, unspecified, not intractable, without status migrainosus: Secondary | ICD-10-CM | POA: Insufficient documentation

## 2013-03-13 DIAGNOSIS — F17200 Nicotine dependence, unspecified, uncomplicated: Secondary | ICD-10-CM | POA: Insufficient documentation

## 2013-03-13 DIAGNOSIS — H53149 Visual discomfort, unspecified: Secondary | ICD-10-CM | POA: Insufficient documentation

## 2013-03-13 DIAGNOSIS — Z862 Personal history of diseases of the blood and blood-forming organs and certain disorders involving the immune mechanism: Secondary | ICD-10-CM | POA: Insufficient documentation

## 2013-03-13 HISTORY — DX: Essential (primary) hypertension: I10

## 2013-03-13 MED ORDER — SODIUM CHLORIDE 0.9 % IV BOLUS (SEPSIS)
500.0000 mL | Freq: Once | INTRAVENOUS | Status: AC
  Administered 2013-03-13: 500 mL via INTRAVENOUS

## 2013-03-13 MED ORDER — DIPHENHYDRAMINE HCL 50 MG/ML IJ SOLN
25.0000 mg | Freq: Once | INTRAMUSCULAR | Status: AC
  Administered 2013-03-13: 25 mg via INTRAVENOUS
  Filled 2013-03-13: qty 1

## 2013-03-13 MED ORDER — METOCLOPRAMIDE HCL 5 MG/ML IJ SOLN
10.0000 mg | Freq: Once | INTRAMUSCULAR | Status: AC
  Administered 2013-03-13: 10 mg via INTRAVENOUS
  Filled 2013-03-13: qty 2

## 2013-03-13 MED ORDER — KETOROLAC TROMETHAMINE 30 MG/ML IJ SOLN
30.0000 mg | Freq: Once | INTRAMUSCULAR | Status: AC
  Administered 2013-03-13: 30 mg via INTRAVENOUS
  Filled 2013-03-13: qty 1

## 2013-03-13 NOTE — ED Notes (Signed)
Per pt, states she feels like her BP is high, c/o headache

## 2013-03-13 NOTE — ED Provider Notes (Signed)
Medical screening examination/treatment/procedure(s) were performed by non-physician practitioner and as supervising physician I was immediately available for consultation/collaboration. Devoria Albe, MD, Armando Gang   Ward Givens, MD 03/13/13 [phone]

## 2013-03-13 NOTE — ED Provider Notes (Signed)
CSN: [id number]     Arrival date & time 03/13/13  1635 History     First MD Initiated Contact with Patient 03/13/13 1646     Chief Complaint  Patient presents with  . Hypertension   (Consider location/radiation/quality/duration/timing/severity/associated sxs/prior Treatment) HPI Comments: Patient is a 32 year old female with a past medical history of migraines and hypertension who presents with a headache since this morning. Patient reports a gradual onset and progressive worsening of the headache. The pain is sharp, constant and is located in generalized head without radiation. Patient has tried nothing for symptoms without relief. No alleviating/aggravating factors. Patient reports associated nausea and photophobia. Patient denies fever, vomiting, diarrhea, numbness/tingling, weakness, visual changes, congestion, chest pain, SOB, abdominal pain. Patient thought she had a headache due to BP elevation.      Past Medical History  Diagnosis Date  . Endometriosis   . Fibroids   . Anemia   . Hypertension    Past Surgical History  Procedure Laterality Date  . Cesarean section     Family History  Problem Relation Age of Onset  . Diabetes Mother   . Hypertension Mother   . Diabetes Father   . Hearing loss Paternal Grandmother    History  Substance Use Topics  . Smoking status: Current Every Day Smoker -- 0.50 packs/day  . Smokeless tobacco: Not on file  . Alcohol Use: Yes   OB History   Grav Para Term Preterm Abortions TAB SAB Ect Mult Living   4 4 4  0 0 0 0 0 0 4     Review of Systems  Eyes: Positive for photophobia.  Gastrointestinal: Positive for nausea.  Neurological: Positive for headaches.  All other systems reviewed and are negative.    Allergies  Review of patient's allergies indicates no known allergies.  Home Medications   Current Outpatient Rx  Name  Route  Sig  Dispense  Refill  . ibuprofen (ADVIL,MOTRIN) 200 MG tablet   Oral   Take 400 mg by mouth  every 6 (six) hours as needed for pain.          BP 136/91  Pulse 63  Temp(Src) 98.1 F (36.7 C)  Resp 18  SpO2 100%  LMP 02/05/2013 Physical Exam  Nursing note and vitals reviewed. Constitutional: She is oriented to person, place, and time. She appears well-developed and well-nourished. No distress.  HENT:  Head: Normocephalic and atraumatic.  Eyes: Conjunctivae and EOM are normal. Pupils are equal, round, and reactive to light.  Neck: Normal range of motion.  Cardiovascular: Normal rate and regular rhythm.  Exam reveals no gallop and no friction rub.   No murmur heard. Pulmonary/Chest: Effort normal and breath sounds normal. She has no wheezes. She has no rales. She exhibits no tenderness.  Abdominal: Soft. She exhibits no distension. There is no tenderness. There is no rebound and no guarding.  Musculoskeletal: Normal range of motion.  Neurological: She is alert and oriented to person, place, and time. Coordination normal.  Speech is goal-oriented. Moves limbs without ataxia.   Skin: Skin is warm and dry.  Psychiatric: She has a normal mood and affect. Her behavior is normal.    ED Course   Procedures (including critical care time)  Labs Reviewed - No data to display No results found. 1. Headache     MDM  4:47 PM Patient will have fluids, toradol, reglan, and benadryl for headache. Vitals stable and patient afebrile. Patient's BP 136/91.  5:45 PM Patient feeling  better after migraine cocktail. Patient will be discharged with instructions to follow up with her PCP. No meningeal signs. Vitals stable and patient afebrile. Patient instructed to return with worsening or concerning symptoms.   Emilia Beck, PA-C 03/13/13 1749

## 2013-03-31 ENCOUNTER — Encounter (HOSPITAL_COMMUNITY): Payer: Self-pay | Admitting: *Deleted

## 2013-03-31 ENCOUNTER — Emergency Department (HOSPITAL_COMMUNITY)
Admission: EM | Admit: 2013-03-31 | Discharge: 2013-03-31 | Disposition: A | Discharge status: Home / Self Care | Payer: Self-pay | Attending: Emergency Medicine | Admitting: Emergency Medicine

## 2013-03-31 DIAGNOSIS — S71009A Unspecified open wound, unspecified hip, initial encounter: Secondary | ICD-10-CM | POA: Insufficient documentation

## 2013-03-31 DIAGNOSIS — Z8742 Personal history of other diseases of the female genital tract: Secondary | ICD-10-CM | POA: Insufficient documentation

## 2013-03-31 DIAGNOSIS — F17200 Nicotine dependence, unspecified, uncomplicated: Secondary | ICD-10-CM | POA: Insufficient documentation

## 2013-03-31 DIAGNOSIS — S71109A Unspecified open wound, unspecified thigh, initial encounter: Secondary | ICD-10-CM | POA: Insufficient documentation

## 2013-03-31 DIAGNOSIS — W268XXA Contact with other sharp object(s), not elsewhere classified, initial encounter: Secondary | ICD-10-CM | POA: Insufficient documentation

## 2013-03-31 DIAGNOSIS — Y929 Unspecified place or not applicable: Secondary | ICD-10-CM | POA: Insufficient documentation

## 2013-03-31 DIAGNOSIS — Z23 Encounter for immunization: Secondary | ICD-10-CM | POA: Insufficient documentation

## 2013-03-31 DIAGNOSIS — Z79899 Other long term (current) drug therapy: Secondary | ICD-10-CM | POA: Insufficient documentation

## 2013-03-31 DIAGNOSIS — Z862 Personal history of diseases of the blood and blood-forming organs and certain disorders involving the immune mechanism: Secondary | ICD-10-CM | POA: Insufficient documentation

## 2013-03-31 DIAGNOSIS — Y9389 Activity, other specified: Secondary | ICD-10-CM | POA: Insufficient documentation

## 2013-03-31 DIAGNOSIS — I10 Essential (primary) hypertension: Secondary | ICD-10-CM | POA: Insufficient documentation

## 2013-03-31 DIAGNOSIS — S71112A Laceration without foreign body, left thigh, initial encounter: Secondary | ICD-10-CM

## 2013-03-31 MED ORDER — TETANUS-DIPHTH-ACELL PERTUSSIS 5-2.5-18.5 LF-MCG/0.5 IM SUSP
0.5000 mL | Freq: Once | INTRAMUSCULAR | Status: AC
  Administered 2013-03-31: 0.5 mL via INTRAMUSCULAR
  Filled 2013-03-31: qty 0.5

## 2013-03-31 MED ORDER — HYDROCODONE-ACETAMINOPHEN 5-325 MG PO TABS
1.0000 | ORAL_TABLET | Freq: Once | ORAL | Status: AC
  Administered 2013-03-31: 1 via ORAL
  Filled 2013-03-31: qty 1

## 2013-03-31 MED ORDER — IBUPROFEN 600 MG PO TABS: 600.0000 mg | ORAL_TABLET | Freq: Four times a day (QID) | ORAL | Status: DC | PRN

## 2013-03-31 NOTE — ED Notes (Signed)
Patient is alert and oriented x3.  She was given DC instructions and follow up visit instructions.  Patient gave verbal understanding. She was DC ambulatory under her own power to home.  V/S stable.  He was not showing any signs of distress on DC 
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
## 2013-04-08 ENCOUNTER — Encounter (HOSPITAL_COMMUNITY): Payer: Self-pay | Admitting: Emergency Medicine

## 2013-04-08 ENCOUNTER — Emergency Department (HOSPITAL_COMMUNITY)
Admission: EM | Admit: 2013-04-08 | Discharge: 2013-04-08 | Disposition: A | Discharge status: Home / Self Care | Payer: Self-pay | Attending: Emergency Medicine | Admitting: Emergency Medicine

## 2013-04-08 DIAGNOSIS — Z862 Personal history of diseases of the blood and blood-forming organs and certain disorders involving the immune mechanism: Secondary | ICD-10-CM | POA: Insufficient documentation

## 2013-04-08 DIAGNOSIS — F17200 Nicotine dependence, unspecified, uncomplicated: Secondary | ICD-10-CM | POA: Insufficient documentation

## 2013-04-08 DIAGNOSIS — Z4802 Encounter for removal of sutures: Secondary | ICD-10-CM | POA: Insufficient documentation

## 2013-04-08 DIAGNOSIS — I10 Essential (primary) hypertension: Secondary | ICD-10-CM | POA: Insufficient documentation

## 2013-04-08 DIAGNOSIS — Z8742 Personal history of other diseases of the female genital tract: Secondary | ICD-10-CM | POA: Insufficient documentation

## 2013-04-08 NOTE — ED Notes (Signed)
Pt arrived to ED with a need to have her sutures removed.  Pt has nine sutures on her left outer thigh.  Pt's sutures do not look inflammed, pt has no fever.

## 2013-04-08 NOTE — ED Provider Notes (Signed)
CSN: [id number]     Arrival date & time 04/08/13  1917 History   First MD Initiated Contact with Patient 04/08/13 1956     Chief Complaint  Patient presents with  . Suture / Staple Removal   (Consider location/radiation/quality/duration/timing/severity/associated sxs/prior Treatment) Patient is a 32 y.o. female presenting with suture removal. The history is provided by the patient and medical records. No language interpreter was used.  Suture / Staple Removal This is a new problem. The current episode started in the past 7 days. The problem has been gradually improving.    Past Medical History  Diagnosis Date  . Endometriosis   . Fibroids   . Anemia   . Hypertension    Past Surgical History  Procedure Laterality Date  . Cesarean section     Family History  Problem Relation Age of Onset  . Diabetes Mother   . Hypertension Mother   . Diabetes Father   . Hearing loss Paternal Grandmother    History  Substance Use Topics  . Smoking status: Current Every Day Smoker -- 0.50 packs/day  . Smokeless tobacco: Not on file  . Alcohol Use: Yes   OB History   Grav Para Term Preterm Abortions TAB SAB Ect Mult Living   4 4 4  0 0 0 0 0 0 4     Review of Systems  All other systems reviewed and are negative.    Allergies  Review of patient's allergies indicates no known allergies.  Home Medications   Current Outpatient Rx  Name  Route  Sig  Dispense  Refill  . ibuprofen (ADVIL,MOTRIN) 200 MG tablet   Oral   Take 400 mg by mouth every 6 (six) hours as needed for pain.         . Multiple Vitamin (MULTIVITAMIN WITH MINERALS) TABS tablet   Oral   Take 1 tablet by mouth daily.          BP 126/74  Pulse 68  Temp(Src) 98.4 F (36.9 C) (Oral)  Resp 16  SpO2 100% Physical Exam  Nursing note and vitals reviewed. Constitutional: She appears well-developed and well-nourished.  Musculoskeletal: She exhibits no edema and no tenderness.  Neurological: She is alert.  Skin:  Skin is warm and dry.     Psychiatric: She has a normal mood and affect. Her behavior is normal. Judgment and thought content normal.    ED Course  Procedures (including critical care time) SUTURE REMOVAL Performed by: Jimmye Norman  Consent: Verbal consent obtained. Patient identity confirmed: provided demographic data Time out: Immediately prior to procedure a "time out" was called to verify the correct patient, procedure, equipment, support staff and site/side marked as required.  Location details: L upper thigh, lateral aspect  Wound Appearance: clean  Sutures/Staples Removed: 8--one suture had pulled through and was no longer present  Facility: sutures placed in this facility Patient tolerance: Patient tolerated the procedure well with no immediate complications.    Labs Review Labs Reviewed - No data to display Imaging Review No results found.  MDM    Suture removal.  Wound reinforced with steri-strips, dressing applied.    Jimmye Norman, NP 04/08/13 2029

## 2013-04-09 NOTE — ED Provider Notes (Signed)
Medical screening examination/treatment/procedure(s) were performed by non-physician practitioner and as supervising physician I was immediately available for consultation/collaboration.   Ladaysha Soutar L Shondrika Hoque, MD 04/09/13 1454 

## 2014-06-01 ENCOUNTER — Encounter (HOSPITAL_COMMUNITY): Payer: Self-pay | Admitting: Emergency Medicine

## 2014-07-30 ENCOUNTER — Inpatient Hospital Stay (HOSPITAL_COMMUNITY)
Admission: AD | Admit: 2014-07-30 | Discharge: 2014-07-30 | Disposition: A | Discharge status: Home / Self Care | Payer: Self-pay | Source: Ambulatory Visit | Attending: Obstetrics and Gynecology | Admitting: Obstetrics and Gynecology

## 2014-07-30 ENCOUNTER — Encounter (HOSPITAL_COMMUNITY): Payer: Self-pay | Admitting: *Deleted

## 2014-07-30 ENCOUNTER — Inpatient Hospital Stay (HOSPITAL_COMMUNITY): Payer: Self-pay

## 2014-07-30 DIAGNOSIS — R1030 Lower abdominal pain, unspecified: Secondary | ICD-10-CM | POA: Insufficient documentation

## 2014-07-30 DIAGNOSIS — O9989 Other specified diseases and conditions complicating pregnancy, childbirth and the puerperium: Secondary | ICD-10-CM | POA: Insufficient documentation

## 2014-07-30 DIAGNOSIS — Z3A01 Less than 8 weeks gestation of pregnancy: Secondary | ICD-10-CM | POA: Insufficient documentation

## 2014-07-30 DIAGNOSIS — O99331 Smoking (tobacco) complicating pregnancy, first trimester: Secondary | ICD-10-CM | POA: Insufficient documentation

## 2014-07-30 DIAGNOSIS — O209 Hemorrhage in early pregnancy, unspecified: Secondary | ICD-10-CM

## 2014-07-30 LAB — WET PREP, GENITAL
Clue Cells Wet Prep HPF POC: NONE SEEN
TRICH WET PREP: NONE SEEN
YEAST WET PREP: NONE SEEN

## 2014-07-30 LAB — CBC
HCT: 32.8 % — ABNORMAL LOW (ref 36.0–46.0)
Hemoglobin: 10.5 g/dL — ABNORMAL LOW (ref 12.0–15.0)
MCH: 24.8 pg — AB (ref 26.0–34.0)
MCHC: 32 g/dL (ref 30.0–36.0)
MCV: 77.5 fL — AB (ref 78.0–100.0)
Platelets: 302 10*3/uL (ref 150–400)
RBC: 4.23 MIL/uL (ref 3.87–5.11)
RDW: 17.1 % — ABNORMAL HIGH (ref 11.5–15.5)
WBC: 6.8 10*3/uL (ref 4.0–10.5)

## 2014-07-30 LAB — BASIC METABOLIC PANEL
Anion gap: 4 — ABNORMAL LOW (ref 5–15)
BUN: 7 mg/dL (ref 6–23)
CHLORIDE: 106 meq/L (ref 96–112)
CO2: 26 mmol/L (ref 19–32)
Calcium: 8.6 mg/dL (ref 8.4–10.5)
Creatinine, Ser: 0.63 mg/dL (ref 0.50–1.10)
GFR calc Af Amer: >90 mL/min (ref >90)
Glucose, Bld: 110 mg/dL — ABNORMAL HIGH (ref 70–99)
POTASSIUM: 3.7 mmol/L (ref 3.5–5.1)
SODIUM: 136 mmol/L (ref 135–145)

## 2014-07-30 LAB — URINALYSIS, ROUTINE W REFLEX MICROSCOPIC
Bilirubin Urine: NEGATIVE
GLUCOSE, UA: NEGATIVE mg/dL
Hgb urine dipstick: NEGATIVE
KETONES UR: NEGATIVE mg/dL
Nitrite: NEGATIVE
PROTEIN: NEGATIVE mg/dL
Specific Gravity, Urine: 1.015 (ref 1.005–1.030)
Urobilinogen, UA: 0.2 mg/dL (ref 0.0–1.0)
pH: 7 (ref 5.0–8.0)

## 2014-07-30 LAB — POCT PREGNANCY, URINE: Preg Test, Ur: POSITIVE — AB

## 2014-07-30 LAB — ABO/RH: ABO/RH(D): O POS

## 2014-07-30 LAB — URINE MICROSCOPIC-ADD ON

## 2014-07-30 LAB — HCG, QUANTITATIVE, PREGNANCY: hCG, Beta Chain, Quant, S: 4520 m[IU]/mL — ABNORMAL HIGH (ref <5)

## 2014-07-30 NOTE — MAU Note (Addendum)
Having lower abd pain, started about a wk ago. Had light bleeding a few days ago.  Now has a thick, white d/c with vag irritation

## 2014-07-30 NOTE — Discharge Instructions (Signed)
Vaginal Bleeding During Pregnancy, First Trimester  A small amount of bleeding (spotting) from the vagina is relatively common in early pregnancy. It usually stops on its own. Various things may cause bleeding or spotting in early pregnancy. Some bleeding may be related to the pregnancy, and some may not. In most cases, the bleeding is normal and is not a problem. However, bleeding can also be a sign of something serious. Be sure to tell your health care provider about any vaginal bleeding right away.  Some possible causes of vaginal bleeding during the first trimester include:  · Infection or inflammation of the cervix.  · Growths (polyps) on the cervix.  · Miscarriage or threatened miscarriage.  · Pregnancy tissue has developed outside of the uterus and in a fallopian tube (tubal pregnancy).  · Tiny cysts have developed in the uterus instead of pregnancy tissue (molar pregnancy).  HOME CARE INSTRUCTIONS   Watch your condition for any changes. The following actions may help to lessen any discomfort you are feeling:  · Follow your health care provider's instructions for limiting your activity. If your health care provider orders bed rest, you may need to stay in bed and only get up to use the bathroom. However, your health care provider may allow you to continue light activity.  · If needed, make plans for someone to help with your regular activities and responsibilities while you are on bed rest.  · Keep track of the number of pads you use each day, how often you change pads, and how soaked (saturated) they are. Write this down.  · Do not use tampons. Do not douche.  · Do not have sexual intercourse or orgasms until approved by your health care provider.  · If you pass any tissue from your vagina, save the tissue so you can show it to your health care provider.  · Only take over-the-counter or prescription medicines as directed by your health care provider.  · Do not take aspirin because it can make you  bleed.  · Keep all follow-up appointments as directed by your health care provider.  SEEK MEDICAL CARE IF:  · You have any vaginal bleeding during any part of your pregnancy.  · You have cramps or labor pains.  · You have a fever, not controlled by medicine.  SEEK IMMEDIATE MEDICAL CARE IF:   · You have severe cramps in your back or belly (abdomen).  · You pass large clots or tissue from your vagina.  · Your bleeding increases.  · You feel light-headed or weak, or you have fainting episodes.  · You have chills.  · You are leaking fluid or have a gush of fluid from your vagina.  · You pass out while having a bowel movement.  MAKE SURE YOU:  · Understand these instructions.  · Will watch your condition.  · Will get help right away if you are not doing well or get worse.  Document Released: 04/26/2005 Document Revised: 07/22/2013 Document Reviewed: 03/24/2013  [employer]® Patient Information ©2015 [employer]. This information is not intended to replace advice given to you by your health care provider. Make sure you discuss any questions you have with your health care provider.

## 2014-07-30 NOTE — MAU Provider Note (Signed)
History     CSN: [id number]  Arrival date and time: 07/30/14 1154   None     Chief Complaint  Patient presents with  . Abdominal Pain   HPI This is a 33 y.o. female at 108w0d who presents with c/o lower abdominal pain x 1 week. Did have some light bleeding several days ago. Has a long history or endometriosis.   RN note  Expand All Collapse All   Having lower abd pain, started about a wk ago. Had light bleeding a few days ago. Now has a thick, white d/c with vag irritation          OB History    Gravida Para Term Preterm AB TAB SAB Ectopic Multiple Living   5 4 4  0 0 0 0 0 0 4      Past Medical History  Diagnosis Date  . Endometriosis   . Fibroids   . Anemia   . Hypertension     Past Surgical History  Procedure Laterality Date  . Cesarean section      Family History  Problem Relation Age of Onset  . Diabetes Mother   . Hypertension Mother   . Diabetes Father   . Hearing loss Paternal Grandmother     History  Substance Use Topics  . Smoking status: Current Every Day Smoker -- 0.50 packs/day  . Smokeless tobacco: Not on file  . Alcohol Use: Yes    Allergies: No Known Allergies  Prescriptions prior to admission  Medication Sig Dispense Refill Last Dose  . ibuprofen (ADVIL,MOTRIN) 200 MG tablet Take 400 mg by mouth every 6 (six) hours as needed for pain.   Past Month at Unknown  . Multiple Vitamin (MULTIVITAMIN WITH MINERALS) TABS tablet Take 1 tablet by mouth daily.   04/07/2013 at Unknown    Review of Systems  Constitutional: Negative for fever and chills.  Gastrointestinal: Positive for abdominal pain. Negative for nausea, vomiting, diarrhea and constipation.  Genitourinary:       Bleeding last week    Physical Exam   Blood pressure 119/83, pulse 63, temperature 98.4 F (36.9 C), temperature source Oral, resp. rate 16, height 5\' 9"  (1.753 m), weight 213 lb (96.616 kg), last menstrual period 06/25/2014.  Physical Exam  Constitutional: She  is oriented to person, place, and time. She appears well-developed and well-nourished. No distress (but very surprised).  HENT:  Head: Normocephalic.  Cardiovascular: Normal rate.   Respiratory: Effort normal.  GI: Soft.  Genitourinary: Vagina normal. No vaginal discharge found.  No blood in vault Uterus somewhat tender Bilateral adnexae nontender  Musculoskeletal: Normal range of motion.  Neurological: She is alert and oriented to person, place, and time.  Skin: Skin is warm and dry.  Psychiatric: She has a normal mood and affect.    MAU Course  Procedures  MDM Results for orders placed or performed during the hospital encounter of 07/30/14 (from the past 24 hour(s))  Urinalysis, Routine w reflex microscopic     Status: Abnormal   Collection Time: 07/30/14 12:10 PM  Result Value Ref Range   Color, Urine YELLOW YELLOW   APPearance HAZY (A) CLEAR   Specific Gravity, Urine 1.015 1.005 - 1.030   pH 7.0 5.0 - 8.0   Glucose, UA NEGATIVE NEGATIVE mg/dL   Hgb urine dipstick NEGATIVE NEGATIVE   Bilirubin Urine NEGATIVE NEGATIVE   Ketones, ur NEGATIVE NEGATIVE mg/dL   Protein, ur NEGATIVE NEGATIVE mg/dL   Urobilinogen, UA 0.2 0.0 - 1.0 mg/dL  Nitrite NEGATIVE NEGATIVE   Leukocytes, UA TRACE (A) NEGATIVE  Urine microscopic-add on     Status: Abnormal   Collection Time: 07/30/14 12:10 PM  Result Value Ref Range   Squamous Epithelial / LPF MANY (A) RARE   WBC, UA 0-2 <3 WBC/hpf   RBC / HPF 0-2 <3 RBC/hpf   Bacteria, UA FEW (A) RARE   Urine-Other MUCOUS PRESENT   Pregnancy, urine POC     Status: Abnormal   Collection Time: 07/30/14 12:15 PM  Result Value Ref Range   Preg Test, Ur POSITIVE (A) NEGATIVE  Wet prep, genital     Status: Abnormal   Collection Time: 07/30/14 12:40 PM  Result Value Ref Range   Yeast Wet Prep HPF POC NONE SEEN NONE SEEN   Trich, Wet Prep NONE SEEN NONE SEEN   Clue Cells Wet Prep HPF POC NONE SEEN NONE SEEN   WBC, Wet Prep HPF POC FEW (A) NONE SEEN   CBC     Status: Abnormal   Collection Time: 07/30/14 12:58 PM  Result Value Ref Range   WBC 6.8 4.0 - 10.5 K/uL   RBC 4.23 3.87 - 5.11 MIL/uL   Hemoglobin 10.5 (L) 12.0 - 15.0 g/dL   HCT 32.8 (L) 36.0 - 46.0 %   MCV 77.5 (L) 78.0 - 100.0 fL   MCH 24.8 (L) 26.0 - 34.0 pg   MCHC 32.0 30.0 - 36.0 g/dL   RDW 17.1 (H) 11.5 - 15.5 %   Platelets 302 150 - 400 K/uL  ABO/Rh     Status: None (Preliminary result)   Collection Time: 07/30/14 12:58 PM  Result Value Ref Range   ABO/RH(D) O POS   hCG, quantitative, pregnancy     Status: Abnormal   Collection Time: 07/30/14 12:58 PM  Result Value Ref Range   hCG, Beta Chain, Quant, S 4520 (H) <5 mIU/mL  Basic metabolic panel     Status: Abnormal   Collection Time: 07/30/14 12:58 PM  Result Value Ref Range   Sodium 136 135 - 145 mmol/L   Potassium 3.7 3.5 - 5.1 mmol/L   Chloride 106 96 - 112 mEq/L   CO2 26 19 - 32 mmol/L   Glucose, Bld 110 (H) 70 - 99 mg/dL   BUN 7 6 - 23 mg/dL   Creatinine, Ser 0.63 0.50 - 1.10 mg/dL   Calcium 8.6 8.4 - 10.5 mg/dL   GFR calc non Af Amer >90 >90 mL/min   GFR calc Af Amer >90 >90 mL/min   Anion gap 4 (L) 5 - 15   US Ob Comp Less 14 Wks  07/30/2014   CLINICAL DATA:  Patient with spotting and abdominal cramping.  EXAM: OBSTETRIC <14 WK [place] AND TRANSVAGINAL OB US  TECHNIQUE: Both transabdominal and transvaginal ultrasound examinations were performed for complete evaluation of the gestation as well as the maternal uterus, adnexal regions, and pelvic cul-de-sac. Transvaginal technique was performed to assess early pregnancy.  COMPARISON:  02/24/2013  FINDINGS: Intrauterine gestational sac: Small endometrial fluid collection, potentially representing gestational sac. This measures 5.4 mm.  Yolk sac:  Not present  Embryo:  Not present  Cardiac Activity: Not present  Maternal uterus/adnexae: Normal right and left ovaries. No adnexal mass identified. Small amount of fluid in the pelvic cul-de-sac.  IMPRESSION: Probable  early intrauterine gestational sac, but no yolk sac, fetal pole, or cardiac activity yet visualized. Recommend follow-up quantitative B-HCG levels and follow-up US in 14 days to confirm and assess viability. This recommendation  follows SRU consensus guidelines: Diagnostic Criteria for Nonviable Pregnancy Early in the First Trimester. [place] Med 2013; 423:9532-02.   Electronically Signed   By: Lovey Newcomer M.D.   On: 07/30/2014 14:44   US Ob Transvaginal  07/30/2014   CLINICAL DATA:  Patient with spotting and abdominal cramping.  EXAM: OBSTETRIC <14 WK [place] AND TRANSVAGINAL OB US  TECHNIQUE: Both transabdominal and transvaginal ultrasound examinations were performed for complete evaluation of the gestation as well as the maternal uterus, adnexal regions, and pelvic cul-de-sac. Transvaginal technique was performed to assess early pregnancy.  COMPARISON:  02/24/2013  FINDINGS: Intrauterine gestational sac: Small endometrial fluid collection, potentially representing gestational sac. This measures 5.4 mm.  Yolk sac:  Not present  Embryo:  Not present  Cardiac Activity: Not present  Maternal uterus/adnexae: Normal right and left ovaries. No adnexal mass identified. Small amount of fluid in the pelvic cul-de-sac.  IMPRESSION: Probable early intrauterine gestational sac, but no yolk sac, fetal pole, or cardiac activity yet visualized. Recommend follow-up quantitative B-HCG levels and follow-up US in 14 days to confirm and assess viability. This recommendation follows SRU consensus guidelines: Diagnostic Criteria for Nonviable Pregnancy Early in the First Trimester. [employer] Med 2013; 334:3568-61.   Electronically Signed   By: Lovey Newcomer M.D.   On: 07/30/2014 14:44   Results for MEZTLI, LLANAS (MRN [id number]) as of 07/30/2014 19:24  Ref. Range 07/30/2014 12:58  hCG, Beta Chain, Quant, S Latest Range: <5 mIU/mL 4520 (H)    Assessment and Plan  A:  Pregnancy at [redacted]w[redacted]d       Cannot rule out Ectopic yet        Lower abdominal pain  P:  Discussed findings       Will have her come back and repeat Quant in 2 days.       Ectopic precautions    Naryah Clenney 07/30/2014, 12:25 PM

## 2014-08-01 ENCOUNTER — Inpatient Hospital Stay (HOSPITAL_COMMUNITY)
Admission: AD | Admit: 2014-08-01 | Discharge: 2014-08-01 | Disposition: A | Discharge status: Home / Self Care | Payer: Self-pay | Source: Ambulatory Visit | Attending: Obstetrics and Gynecology | Admitting: Obstetrics and Gynecology

## 2014-08-01 DIAGNOSIS — Z3A01 Less than 8 weeks gestation of pregnancy: Secondary | ICD-10-CM | POA: Insufficient documentation

## 2014-08-01 DIAGNOSIS — R1030 Lower abdominal pain, unspecified: Secondary | ICD-10-CM | POA: Insufficient documentation

## 2014-08-01 DIAGNOSIS — E349 Endocrine disorder, unspecified: Secondary | ICD-10-CM

## 2014-08-01 DIAGNOSIS — O2341 Unspecified infection of urinary tract in pregnancy, first trimester: Secondary | ICD-10-CM

## 2014-08-01 DIAGNOSIS — O26851 Spotting complicating pregnancy, first trimester: Secondary | ICD-10-CM

## 2014-08-01 DIAGNOSIS — O9989 Other specified diseases and conditions complicating pregnancy, childbirth and the puerperium: Secondary | ICD-10-CM | POA: Insufficient documentation

## 2014-08-01 LAB — HCG, QUANTITATIVE, PREGNANCY: hCG, Beta Chain, Quant, S: 9294 m[IU]/mL — ABNORMAL HIGH (ref <5)

## 2014-08-01 NOTE — Discharge Instructions (Signed)

## 2014-08-01 NOTE — MAU Provider Note (Signed)
Lowell Guitar 34 y.o. [id number] @[redacted]w[redacted]d  returns to MAU for follow up quant HCG.  She denies increase in abdominal pain and reports no vaginal bleeding.   O:  HCG 07/30/14: 4520 HCG 08/01/14: 9294  A: Appropriate rise in HCG  P: Discharge to home Return for follow up quant again in 48 hours for serial measurements Ectopic precautions discussed.   Patient may return to MAU as needed or if her condition were to change or worsen

## 2014-08-01 NOTE — MAU Note (Signed)
Pt states here for repeat labs. Having lower abdominal pain still that seems to be getting worse. No bleeding.

## 2014-08-02 LAB — GC/CHLAMYDIA PROBE AMP
CT PROBE, AMP APTIMA: NEGATIVE
GC PROBE AMP APTIMA: NEGATIVE

## 2014-08-05 ENCOUNTER — Inpatient Hospital Stay (HOSPITAL_COMMUNITY): Payer: Self-pay

## 2014-08-05 ENCOUNTER — Encounter (HOSPITAL_COMMUNITY): Payer: Self-pay | Admitting: *Deleted

## 2014-08-05 ENCOUNTER — Telehealth: Payer: Self-pay | Admitting: Medical

## 2014-08-05 ENCOUNTER — Inpatient Hospital Stay (HOSPITAL_COMMUNITY)
Admission: AD | Admit: 2014-08-05 | Discharge: 2014-08-05 | Disposition: A | Discharge status: Home / Self Care | Payer: Self-pay | Source: Ambulatory Visit | Attending: Family Medicine | Admitting: Family Medicine

## 2014-08-05 DIAGNOSIS — O26899 Other specified pregnancy related conditions, unspecified trimester: Secondary | ICD-10-CM

## 2014-08-05 DIAGNOSIS — Z3491 Encounter for supervision of normal pregnancy, unspecified, first trimester: Secondary | ICD-10-CM

## 2014-08-05 DIAGNOSIS — O99331 Smoking (tobacco) complicating pregnancy, first trimester: Secondary | ICD-10-CM | POA: Insufficient documentation

## 2014-08-05 DIAGNOSIS — O2341 Unspecified infection of urinary tract in pregnancy, first trimester: Secondary | ICD-10-CM | POA: Insufficient documentation

## 2014-08-05 DIAGNOSIS — R109 Unspecified abdominal pain: Secondary | ICD-10-CM

## 2014-08-05 DIAGNOSIS — Z3A01 Less than 8 weeks gestation of pregnancy: Secondary | ICD-10-CM | POA: Insufficient documentation

## 2014-08-05 DIAGNOSIS — N390 Urinary tract infection, site not specified: Secondary | ICD-10-CM

## 2014-08-05 LAB — URINE MICROSCOPIC-ADD ON

## 2014-08-05 LAB — URINALYSIS, ROUTINE W REFLEX MICROSCOPIC
Bilirubin Urine: NEGATIVE
Glucose, UA: NEGATIVE mg/dL
KETONES UR: NEGATIVE mg/dL
LEUKOCYTES UA: NEGATIVE
NITRITE: NEGATIVE
PH: 6 (ref 5.0–8.0)
Protein, ur: NEGATIVE mg/dL
Specific Gravity, Urine: 1.025 (ref 1.005–1.030)
Urobilinogen, UA: 0.2 mg/dL (ref 0.0–1.0)

## 2014-08-05 LAB — HCG, QUANTITATIVE, PREGNANCY: HCG, BETA CHAIN, QUANT, S: 23066 m[IU]/mL — AB (ref <5)

## 2014-08-05 MED ORDER — CEPHALEXIN 500 MG PO CAPS: 500.0000 mg | ORAL_CAPSULE | Freq: Four times a day (QID) | ORAL | Status: DC

## 2014-08-05 NOTE — MAU Note (Signed)
Urine in lab 

## 2014-08-05 NOTE — MAU Note (Signed)
Also reports chest pain, comes and goes. None now, but had them yesterday.  Points to area about "one o'clock" position on left breast- but inside.  Having them a while, doesn't know if related, but was experiencing prior to getting pregnant.

## 2014-08-05 NOTE — Discharge Instructions (Signed)

## 2014-08-05 NOTE — MAU Note (Signed)
Has been having light vag bleeding, cramping, low back pain and constipation for the past 2 days.

## 2014-08-05 NOTE — MAU Provider Note (Signed)
History     CSN: [id number]  Arrival date and time: 08/05/14 1505   First Provider Initiated Contact with Patient 08/05/14 1706      Chief Complaint  Patient presents with  . Vaginal Bleeding  . Abdominal Cramping  . Back Pain  . Constipation   HPI   [name] is a 34 y.o. female [id number] at [redacted]w[redacted]d who presents with back pain, abdominal cramping and spotting. She originally seen  last Thursday with spotting and abdominal pain. She was brought back 48 hours later for a repeat Quant that did rise appropriatly. She was again told to come back in 48 hours for repeat beta hcg; she missed this appointment.  She presents today with worsening abdominal cramping; she continues to having vaginal spotting; very light.   OB History    Gravida Para Term Preterm AB TAB SAB Ectopic Multiple Living   5 4 4  0 0 0 0 0 0 4      Past Medical History  Diagnosis Date  . Endometriosis   . Fibroids   . Anemia   . Hypertension     Past Surgical History  Procedure Laterality Date  . Cesarean section      Family History  Problem Relation Age of Onset  . Diabetes Mother   . Hypertension Mother   . Diabetes Father   . Hearing loss Paternal Grandmother     History  Substance Use Topics  . Smoking status: Current Every Day Smoker -- 0.50 packs/day  . Smokeless tobacco: Not on file  . Alcohol Use: Yes    Allergies: No Known Allergies  No prescriptions prior to admission   Results for orders placed or performed during the hospital encounter of 08/05/14 (from the past 48 hour(s))  Urinalysis, Routine w reflex microscopic     Status: Abnormal   Collection Time: 08/05/14  3:30 PM  Result Value Ref Range   Color, Urine YELLOW YELLOW   APPearance CLEAR CLEAR   Specific Gravity, Urine 1.025 1.005 - 1.030   pH 6.0 5.0 - 8.0   Glucose, UA NEGATIVE NEGATIVE mg/dL   Hgb urine dipstick TRACE (A) NEGATIVE   Bilirubin Urine NEGATIVE NEGATIVE   Ketones, ur NEGATIVE NEGATIVE mg/dL    Protein, ur NEGATIVE NEGATIVE mg/dL   Urobilinogen, UA 0.2 0.0 - 1.0 mg/dL   Nitrite NEGATIVE NEGATIVE   Leukocytes, UA NEGATIVE NEGATIVE  Urine microscopic-add on     Status: Abnormal   Collection Time: 08/05/14  3:30 PM  Result Value Ref Range   Squamous Epithelial / LPF FEW (A) RARE   WBC, UA 0-2 <3 WBC/hpf   RBC / HPF 0-2 <3 RBC/hpf   Bacteria, UA MANY (A) RARE   Crystals CA OXALATE CRYSTALS (A) NEGATIVE  hCG, quantitative, pregnancy     Status: Abnormal   Collection Time: 08/05/14  5:55 PM  Result Value Ref Range   hCG, Beta Chain, Quant, S 23066 (H) <5 mIU/mL    Comment:          GEST. AGE      CONC.  (mIU/mL)   <=1 WEEK        5 - 50     2 WEEKS       50 - 500     3 WEEKS       100 - 10,000     4 WEEKS     1,000 - 30,000     5 WEEKS     3,500 -  115,000   6-8 WEEKS     12,000 - 270,000    12 WEEKS     15,000 - 220,000        FEMALE AND NON-PREGNANT FEMALE:     LESS THAN 5 mIU/mL      US Ob Transvaginal  08/05/2014   CLINICAL DATA:  Abdominal pain in pregnancy.  Follow up viability.  EXAM: TRANSVAGINAL OB ULTRASOUND  TECHNIQUE: Transvaginal ultrasound was performed for complete evaluation of the gestation as well as the maternal uterus, adnexal regions, and pelvic cul-de-sac.  COMPARISON:  Ultrasound 07/30/2014 demonstrating intrauterine gestational sac but no yolk sac or fetal pole.  FINDINGS: Intrauterine gestational sac: Visualized/normal in shape. There is a small subchorionic hemorrhage measuring 1.9 x 0.9 x 1.9 cm.  Yolk sac:  Present.  Embryo:  Present.  Cardiac Activity: Present.  Heart Rate: 113 bpm  CRL:   2.3  mm   5 w 6 d                  [place] EDC: 04/01/2015  Maternal uterus/adnexae: The right ovary is normal in size with normal blood flow and contains a corpus luteal cyst. The left ovary is normal in size with normal blood flow and contains a corpus luteal cyst. There is no free fluid.  IMPRESSION: Early intrauterine pregnancy. There is now a yolk sac, fetal pole and  fetal cardiac activity. Estimated gestational age [redacted] weeks 6 days for estimated date of delivery 04/01/2015. There is a small subchorionic hemorrhage.   Electronically Signed   By: Jeb Levering M.D.   On: 08/05/2014 18:05    Review of Systems  Constitutional: Negative for fever and chills.  Gastrointestinal: Positive for abdominal pain (Lower abdominal pain ) and constipation (Hard stool. Last BM was 2 days ago. ). Negative for nausea and vomiting.  Musculoskeletal: Positive for back pain.   Physical Exam   Blood pressure 126/75, pulse 84, temperature 98 F (36.7 C), temperature source Oral, resp. rate 18, weight 97.07 kg (214 lb), last menstrual period 06/25/2014.  Physical Exam  Constitutional: She is oriented to person, place, and time. She appears well-developed and well-nourished.  Non-toxic appearance. She does not have a sickly appearance. She does not appear ill. No distress.  HENT:  Head: Normocephalic.  Eyes: Pupils are equal, round, and reactive to light.  Neck: Normal range of motion.  GI: Soft. She exhibits no distension. There is no tenderness. There is no rebound, no guarding and no CVA tenderness.  Musculoskeletal: Normal range of motion.  Neurological: She is alert and oriented to person, place, and time.  Skin: Skin is warm. She is not diaphoretic.  Psychiatric: Her behavior is normal.    MAU Course  Procedures  None  MDM UA Informed patient of [place] report Urine culture  UA results were not back prior to the patient being discharged. I attempted to call her at 2020 to discuss urine results. I have sent Keflex to the pharmacy for UTI. Urine culture is pending.   Assessment and Plan   A:  IUP with cardiac activity.  UTI  P:  Discharge home in stable condition Return to MAU if symptoms worsen Start prenatal care ASAP Picture provided to patient.   RX: Keflex Pelvic rest Prenatal vitamins daily Urine culture pending   Darrelyn Hillock Huntley Knoop,  NP 08/05/2014 8:27 PM

## 2014-08-05 NOTE — Telephone Encounter (Signed)
Patient returned call to MAU. UA was not resulted before time of discharge, showed UTI. Rx for Keflex already sent by NP. Patient aware of diagnosis and need for Rx that has been sent to pharmacy. Advised to increased PO hydration. Warning signs for pyelonephritis discussed. Patient voiced understanding.   Luvenia Redden, PA-C 08/05/2014 9:02 PM

## 2014-09-17 ENCOUNTER — Inpatient Hospital Stay (HOSPITAL_COMMUNITY)
Admission: AD | Admit: 2014-09-17 | Discharge: 2014-09-17 | Discharge status: Against Medical Advice | Payer: Medicaid - Out of State | Source: Ambulatory Visit | Attending: Obstetrics & Gynecology | Admitting: Obstetrics & Gynecology

## 2014-09-17 DIAGNOSIS — R109 Unspecified abdominal pain: Secondary | ICD-10-CM | POA: Insufficient documentation

## 2014-09-17 DIAGNOSIS — Z3A12 12 weeks gestation of pregnancy: Secondary | ICD-10-CM | POA: Insufficient documentation

## 2014-09-17 DIAGNOSIS — O9989 Other specified diseases and conditions complicating pregnancy, childbirth and the puerperium: Secondary | ICD-10-CM | POA: Insufficient documentation

## 2014-09-17 DIAGNOSIS — M545 Low back pain: Secondary | ICD-10-CM | POA: Insufficient documentation

## 2014-09-17 LAB — URINALYSIS, ROUTINE W REFLEX MICROSCOPIC
Bilirubin Urine: NEGATIVE
Glucose, UA: NEGATIVE mg/dL
Ketones, ur: NEGATIVE mg/dL
Nitrite: NEGATIVE
PROTEIN: NEGATIVE mg/dL
Specific Gravity, Urine: 1.015 (ref 1.005–1.030)
Urobilinogen, UA: 0.2 mg/dL (ref 0.0–1.0)
pH: 6 (ref 5.0–8.0)

## 2014-09-17 LAB — URINE MICROSCOPIC-ADD ON

## 2014-09-17 NOTE — MAU Note (Signed)
Pt had a family emergency, may come back

## 2014-09-17 NOTE — MAU Note (Signed)
Has had diarrhea off and on through preg.

## 2014-09-17 NOTE — MAU Note (Signed)
Lower back 3/4. Pain began yesterday. vomting yesterday x1.

## 2014-09-17 NOTE — MAU Note (Signed)
Has been nauseated. Threw up once yesterday.   Having pain in low back, on left side. Started a couple days ago- comes and goes.

## 2014-09-17 NOTE — MAU Provider Note (Signed)
  History     CSN: [id number]  Arrival date and time: 09/17/14 1032   None     Chief Complaint  Patient presents with  . Nausea  . Back Pain   HPI This is a 34 y.o. female at [redacted]w[redacted]d who presents with c/o nausea. Left without being seen by me.  Rn Note  Expand All Collapse All   Has been nauseated. Threw up once yesterday. Having pain in low back, on left side. Started a couple days ago- comes and goes.          OB History    Gravida Para Term Preterm AB TAB SAB Ectopic Multiple Living   5 4 4  0 0 0 0 0 0 4      Past Medical History  Diagnosis Date  . Endometriosis   . Fibroids   . Anemia   . Hypertension     Past Surgical History  Procedure Laterality Date  . Cesarean section      Family History  Problem Relation Age of Onset  . Diabetes Mother   . Hypertension Mother   . Diabetes Father   . Hearing loss Paternal Grandmother     History  Substance Use Topics  . Smoking status: Current Every Day Smoker -- 0.50 packs/day  . Smokeless tobacco: Not on file  . Alcohol Use: Yes    Allergies: No Known Allergies  Prescriptions prior to admission  Medication Sig Dispense Refill Last Dose  . cephALEXin (KEFLEX) 500 MG capsule Take 1 capsule (500 mg total) by mouth 4 (four) times daily. 20 capsule 0     ROS Physical Exam   Blood pressure 140/81, pulse 70, temperature 98.6 F (37 C), temperature source Oral, resp. rate 18, height 5\' 8"  (1.727 m), weight 221 lb (100.245 kg), last menstrual period 06/25/2014, SpO2 99 %.  Physical Exam  MAU Course  Procedures  MDM   Assessment and Plan  A:  SIUP at [redacted]w[redacted]d       Nausea     P:  Left without being seen  [facility] 09/17/2014, 12:11 PM

## 2014-09-24 ENCOUNTER — Inpatient Hospital Stay (HOSPITAL_COMMUNITY)
Admission: AD | Admit: 2014-09-24 | Discharge: 2014-09-24 | Disposition: A | Discharge status: Home / Self Care | Payer: Self-pay | Source: Ambulatory Visit | Attending: Family Medicine | Admitting: Family Medicine

## 2014-09-24 DIAGNOSIS — J069 Acute upper respiratory infection, unspecified: Secondary | ICD-10-CM | POA: Insufficient documentation

## 2014-09-24 DIAGNOSIS — F17200 Nicotine dependence, unspecified, uncomplicated: Secondary | ICD-10-CM | POA: Diagnosis present

## 2014-09-24 DIAGNOSIS — Z3490 Encounter for supervision of normal pregnancy, unspecified, unspecified trimester: Secondary | ICD-10-CM

## 2014-09-24 DIAGNOSIS — O9989 Other specified diseases and conditions complicating pregnancy, childbirth and the puerperium: Secondary | ICD-10-CM | POA: Insufficient documentation

## 2014-09-24 DIAGNOSIS — Z3A13 13 weeks gestation of pregnancy: Secondary | ICD-10-CM | POA: Insufficient documentation

## 2014-09-24 DIAGNOSIS — O99331 Smoking (tobacco) complicating pregnancy, first trimester: Secondary | ICD-10-CM | POA: Insufficient documentation

## 2014-09-24 MED ORDER — AMOXICILLIN 500 MG PO CAPS: 500.0000 mg | ORAL_CAPSULE | Freq: Three times a day (TID) | ORAL | Status: DC

## 2014-09-24 NOTE — MAU Provider Note (Signed)
  History     CSN: [id number]  Arrival date and time: 09/24/14 1153   First Provider Initiated Contact with Patient 09/24/14 1410      Chief Complaint  Patient presents with  . Sore Throat  . Cough   HPI Comments: Wendy Floyd 34 y.o. [id number] [redacted]w[redacted]d presents to MAU with sore throat and coughing up yellow sputum x 3 days. She felt feverish a few days ago but temp is normal now. She is from [place] and will be going back there for prenatal care.   Sore Throat  Associated symptoms include coughing.  Cough Associated symptoms include a fever and a sore throat.      Past Medical History  Diagnosis Date  . Endometriosis   . Fibroids   . Anemia   . Hypertension     Past Surgical History  Procedure Laterality Date  . Cesarean section      Family History  Problem Relation Age of Onset  . Diabetes Mother   . Hypertension Mother   . Diabetes Father   . Hearing loss Paternal Grandmother     History  Substance Use Topics  . Smoking status: Current Every Day Smoker -- 0.50 packs/day  . Smokeless tobacco: Not on file  . Alcohol Use: Yes    Allergies: No Known Allergies  Prescriptions prior to admission  Medication Sig Dispense Refill Last Dose  . cephALEXin (KEFLEX) 500 MG capsule Take 1 capsule (500 mg total) by mouth 4 (four) times daily. 20 capsule 0     Review of Systems  Constitutional: Positive for fever.  HENT: Positive for sore throat.   Respiratory: Positive for cough.   Gastrointestinal: Negative.   Genitourinary: Negative.   Musculoskeletal: Negative.   Neurological: Negative.   Psychiatric/Behavioral: Negative.    Physical Exam   Blood pressure 123/72, pulse 87, temperature 98.3 F (36.8 C), temperature source Oral, resp. rate 18, height 5\' 8"  (1.727 m), weight 99.565 kg (219 lb 8 oz), last menstrual period 06/25/2014, SpO2 100 %.  Physical Exam  Constitutional: She is oriented to person, place, and time. She appears well-developed and  well-nourished. No distress.  HENT:  Mouth/Throat: Posterior oropharyngeal erythema present. No oropharyngeal exudate or posterior oropharyngeal edema.  Eyes: Conjunctivae are normal. Pupils are equal, round, and reactive to light.  Neck: Normal range of motion. Neck supple.  Cardiovascular: Normal rate, regular rhythm and normal heart sounds.   Respiratory: Effort normal and breath sounds normal. No respiratory distress. She has no wheezes. She has no rales.  GI:  +FHT  Musculoskeletal: Normal range of motion.  Lymphadenopathy:    She has cervical adenopathy.  Neurological: She is alert and oriented to person, place, and time.  Skin: Skin is warm and dry.  Psychiatric: She has a normal mood and affect. Her behavior is normal. Judgment and thought content normal.    MAU Course  Procedures  MDM   Assessment and Plan   A: URI  P: Amoxicillin 500 mg TID x 5 days List of OTC medications safe in pregnancy Advised on comfort measures Return to MAU as needed  Georgia Duff 09/24/2014, 2:10 PM

## 2014-09-24 NOTE — Discharge Instructions (Signed)
Upper Respiratory Infection, Adult An upper respiratory infection (URI) is also sometimes known as the common cold. The upper respiratory tract includes the nose, sinuses, throat, trachea, and bronchi. Bronchi are the airways leading to the lungs. Most people improve within 1 week, but symptoms can last up to 2 weeks. A residual cough may last even longer.  CAUSES Many different viruses can infect the tissues lining the upper respiratory tract. The tissues become irritated and inflamed and often become very moist. Mucus production is also common. A cold is contagious. You can easily spread the virus to others by oral contact. This includes kissing, sharing a glass, coughing, or sneezing. Touching your mouth or nose and then touching a surface, which is then touched by another person, can also spread the virus. SYMPTOMS  Symptoms typically develop 1 to 3 days after you come in contact with a cold virus. Symptoms vary from person to person. They may include:  Runny nose.  Sneezing.  Nasal congestion.  Sinus irritation.  Sore throat.  Loss of voice (laryngitis).  Cough.  Fatigue.  Muscle aches.  Loss of appetite.  Headache.  Low-grade fever. DIAGNOSIS  You might diagnose your own cold based on familiar symptoms, since most people get a cold 2 to 3 times a year. Your caregiver can confirm this based on your exam. Most importantly, your caregiver can check that your symptoms are not due to another disease such as strep throat, sinusitis, pneumonia, asthma, or epiglottitis. Blood tests, throat tests, and X-rays are not necessary to diagnose a common cold, but they may sometimes be helpful in excluding other more serious diseases. Your caregiver will decide if any further tests are required. RISKS AND COMPLICATIONS  You may be at risk for a more severe case of the common cold if you smoke cigarettes, have chronic heart disease (such as heart failure) or lung disease (such as asthma), or if  you have a weakened immune system. The very young and very old are also at risk for more serious infections. Bacterial sinusitis, middle ear infections, and bacterial pneumonia can complicate the common cold. The common cold can worsen asthma and chronic obstructive pulmonary disease (COPD). Sometimes, these complications can require emergency medical care and may be life-threatening. PREVENTION  The best way to protect against getting a cold is to practice good hygiene. Avoid oral or hand contact with people with cold symptoms. Wash your hands often if contact occurs. There is no clear evidence that vitamin C, vitamin E, echinacea, or exercise reduces the chance of developing a cold. However, it is always recommended to get plenty of rest and practice good nutrition. TREATMENT  Treatment is directed at relieving symptoms. There is no cure. Antibiotics are not effective, because the infection is caused by a virus, not by bacteria. Treatment may include:  Increased fluid intake. Sports drinks offer valuable electrolytes, sugars, and fluids.  Breathing heated mist or steam (vaporizer or shower).  Eating chicken soup or other clear broths, and maintaining good nutrition.  Getting plenty of rest.  Using gargles or lozenges for comfort.  Controlling fevers with ibuprofen or acetaminophen as directed by your caregiver.  Increasing usage of your inhaler if you have asthma. Zinc gel and zinc lozenges, taken in the first 24 hours of the common cold, can shorten the duration and lessen the severity of symptoms. Pain medicines may help with fever, muscle aches, and throat pain. A variety of non-prescription medicines are available to treat congestion and runny nose. Your caregiver   can make recommendations and may suggest nasal or lung inhalers for other symptoms.  HOME CARE INSTRUCTIONS   Only take over-the-counter or prescription medicines for pain, discomfort, or fever as directed by your  caregiver.  Use a warm mist humidifier or inhale steam from a shower to increase air moisture. This may keep secretions moist and make it easier to breathe.  Drink enough water and fluids to keep your urine clear or pale yellow.  Rest as needed.  Return to work when your temperature has returned to normal or as your caregiver advises. You may need to stay home longer to avoid infecting others. You can also use a face mask and careful hand washing to prevent spread of the virus. SEEK MEDICAL CARE IF:   After the first few days, you feel you are getting worse rather than better.  You need your caregiver's advice about medicines to control symptoms.  You develop chills, worsening shortness of breath, or brown or red sputum. These may be signs of pneumonia.  You develop yellow or brown nasal discharge or pain in the face, especially when you bend forward. These may be signs of sinusitis.  You develop a fever, swollen neck glands, pain with swallowing, or white areas in the back of your throat. These may be signs of strep throat. SEEK IMMEDIATE MEDICAL CARE IF:   You have a fever.  You develop severe or persistent headache, ear pain, sinus pain, or chest pain.  You develop wheezing, a prolonged cough, cough up blood, or have a change in your usual mucus (if you have chronic lung disease).  You develop sore muscles or a stiff neck. Document Released: 01/10/2001 Document Revised: 10/09/2011 Document Reviewed: 10/22/2013 ExitCare Patient Information 2015 [employer]. This information is not intended to replace advice given to you by your health care provider. Make sure you discuss any questions you have with your health care provider.  

## 2014-09-24 NOTE — MAU Note (Signed)
Pt reports she has had a sore throat and cough for the past 3 days. Stated she feel like she might be running a fever has not checked temp. Feels a little SOB with the coughing.

## 2014-09-28 ENCOUNTER — Emergency Department (HOSPITAL_COMMUNITY)
Admission: EM | Admit: 2014-09-28 | Discharge: 2014-09-28 | Discharge status: Against Medical Advice | Payer: Medicaid - Out of State | Attending: Emergency Medicine | Admitting: Emergency Medicine

## 2014-09-28 ENCOUNTER — Inpatient Hospital Stay (HOSPITAL_COMMUNITY)
Admission: AD | Admit: 2014-09-28 | Discharge: 2014-09-28 | Disposition: A | Discharge status: Home / Self Care | Payer: Medicaid - Out of State | Source: Ambulatory Visit | Attending: Obstetrics and Gynecology | Admitting: Obstetrics and Gynecology

## 2014-09-28 ENCOUNTER — Encounter (HOSPITAL_COMMUNITY): Payer: Self-pay | Admitting: *Deleted

## 2014-09-28 ENCOUNTER — Encounter (HOSPITAL_COMMUNITY): Payer: Self-pay | Admitting: Emergency Medicine

## 2014-09-28 DIAGNOSIS — O98811 Other maternal infectious and parasitic diseases complicating pregnancy, first trimester: Secondary | ICD-10-CM | POA: Insufficient documentation

## 2014-09-28 DIAGNOSIS — J0120 Acute ethmoidal sinusitis, unspecified: Secondary | ICD-10-CM

## 2014-09-28 DIAGNOSIS — R05 Cough: Secondary | ICD-10-CM | POA: Insufficient documentation

## 2014-09-28 DIAGNOSIS — B9689 Other specified bacterial agents as the cause of diseases classified elsewhere: Secondary | ICD-10-CM | POA: Insufficient documentation

## 2014-09-28 DIAGNOSIS — Z87891 Personal history of nicotine dependence: Secondary | ICD-10-CM | POA: Insufficient documentation

## 2014-09-28 DIAGNOSIS — Z3A00 Weeks of gestation of pregnancy not specified: Secondary | ICD-10-CM | POA: Insufficient documentation

## 2014-09-28 DIAGNOSIS — B373 Candidiasis of vulva and vagina: Secondary | ICD-10-CM | POA: Insufficient documentation

## 2014-09-28 DIAGNOSIS — J018 Other acute sinusitis: Secondary | ICD-10-CM | POA: Insufficient documentation

## 2014-09-28 DIAGNOSIS — O9989 Other specified diseases and conditions complicating pregnancy, childbirth and the puerperium: Secondary | ICD-10-CM | POA: Insufficient documentation

## 2014-09-28 DIAGNOSIS — Z3A13 13 weeks gestation of pregnancy: Secondary | ICD-10-CM | POA: Insufficient documentation

## 2014-09-28 DIAGNOSIS — I10 Essential (primary) hypertension: Secondary | ICD-10-CM | POA: Insufficient documentation

## 2014-09-28 LAB — URINALYSIS, ROUTINE W REFLEX MICROSCOPIC
Bilirubin Urine: NEGATIVE
Glucose, UA: NEGATIVE mg/dL
Ketones, ur: NEGATIVE mg/dL
NITRITE: NEGATIVE
PH: 6 (ref 5.0–8.0)
Protein, ur: NEGATIVE mg/dL
Urobilinogen, UA: 0.2 mg/dL (ref 0.0–1.0)

## 2014-09-28 LAB — URINE MICROSCOPIC-ADD ON

## 2014-09-28 MED ORDER — ALBUTEROL SULFATE (2.5 MG/3ML) 0.083% IN NEBU
2.5000 mg | INHALATION_SOLUTION | Freq: Once | RESPIRATORY_TRACT | Status: AC
  Administered 2014-09-28: 2.5 mg via RESPIRATORY_TRACT
  Filled 2014-09-28: qty 3

## 2014-09-28 MED ORDER — ALBUTEROL SULFATE HFA 108 (90 BASE) MCG/ACT IN AERS: 2.0000 | INHALATION_SPRAY | Freq: Four times a day (QID) | RESPIRATORY_TRACT | Status: AC | PRN

## 2014-09-28 MED ORDER — IPRATROPIUM BROMIDE 0.02 % IN SOLN
0.5000 mg | Freq: Once | RESPIRATORY_TRACT | Status: AC
  Administered 2014-09-28: 0.5 mg via RESPIRATORY_TRACT
  Filled 2014-09-28: qty 2.5

## 2014-09-28 MED ORDER — FLUCONAZOLE 150 MG PO TABS
150.0000 mg | ORAL_TABLET | Freq: Once | ORAL | Status: AC
  Administered 2014-09-28: 150 mg via ORAL
  Filled 2014-09-28: qty 1

## 2014-09-28 MED ORDER — FLUCONAZOLE 150 MG PO TABS: 150.0000 mg | ORAL_TABLET | Freq: Every day | ORAL | Status: DC

## 2014-09-28 MED ORDER — AMOXICILLIN-POT CLAVULANATE ER 1000-62.5 MG PO TB12: 2.0000 | ORAL_TABLET | Freq: Two times a day (BID) | ORAL | Status: DC

## 2014-09-28 NOTE — Discharge Instructions (Signed)
Sinusitis °Sinusitis is redness, soreness, and puffiness (inflammation) of the air pockets in the bones of your face (sinuses). The redness, soreness, and puffiness can cause air and mucus to get trapped in your sinuses. This can allow germs to grow and cause an infection.  °HOME CARE  °· Drink enough fluids to keep your pee (urine) clear or pale yellow. °· Use a humidifier in your home. °· Run a hot shower to create steam in the bathroom. Sit in the bathroom with the door closed. Breathe in the steam 3-4 times a day. °· Put a warm, moist washcloth on your face 3-4 times a day, or as told by your doctor. °· Use salt water sprays (saline sprays) to wet the thick fluid in your nose. This can help the sinuses drain. °· Only take medicine as told by your doctor. °GET HELP RIGHT AWAY IF:  °· Your pain gets worse. °· You have very bad headaches. °· You are sick to your stomach (nauseous). °· You throw up (vomit). °· You are very sleepy (drowsy) all the time. °· Your face is puffy (swollen). °· Your vision changes. °· You have a stiff neck. °· You have trouble breathing. °MAKE SURE YOU:  °· Understand these instructions. °· Will watch your condition. °· Will get help right away if you are not doing well or get worse. °Document Released: 01/03/2008 Document Revised: 04/10/2012 Document Reviewed: 02/20/2012 °[employer]® Patient Information ©2015 [employer]. This information is not intended to replace advice given to you by your health care provider. Make sure you discuss any questions you have with your health care provider. ° °

## 2014-09-28 NOTE — MAU Provider Note (Signed)
History     CSN: [id number]  Arrival date and time: 09/28/14 2005   First Provider Initiated Contact with Patient 09/28/14 2109      No chief complaint on file.  HPI  [name] is a 34 y.o. [id number] at [redacted]w[redacted]d who presents to MAU today with complaint of sore throat, cough with sputum production and nasal congestion x 9 days. Patient was seen in MAU 09/24/14 with the same symptoms and given Rx for Amoxicillin. Patient states that she completed the entire course. She feels as though her symptoms have worsened. She denies fever, ear pain, drainage or vaginal bleeding. She states vomiting after coughing only and lower bilateral abdominal pain, she rates her pain at 6/10. She is also concerned that antibiotics gave her a yeast infection. She endorses a thick, white discharge with itching. She denies rash.   OB History    Gravida Para Term Preterm AB TAB SAB Ectopic Multiple Living   5 4 4  0 0 0 0 0 0 4      Past Medical History  Diagnosis Date  . Endometriosis   . Fibroids   . Anemia   . Hypertension     Past Surgical History  Procedure Laterality Date  . Cesarean section      Family History  Problem Relation Age of Onset  . Diabetes Mother   . Hypertension Mother   . Diabetes Father   . Hearing loss Paternal Grandmother     History  Substance Use Topics  . Smoking status: Former Smoker -- 0.50 packs/day    Quit date: 07/30/2014  . Smokeless tobacco: Not on file  . Alcohol Use: Yes    Allergies: No Known Allergies  Prescriptions prior to admission  Medication Sig Dispense Refill Last Dose  . acetaminophen (TYLENOL) 325 MG tablet Take 325 mg by mouth every 6 (six) hours as needed for mild pain.   Past Week at Unknown time  . Prenatal Vit-Fe Fumarate-FA (PRENATAL MULTIVITAMIN) TABS tablet Take 1 tablet by mouth daily at 12 noon.   09/28/2014 at Unknown time  . amoxicillin (AMOXIL) 500 MG capsule Take 1 capsule (500 mg total) by mouth 3 (three) times daily.  (Patient not taking: Reported on 09/28/2014) 15 capsule 0 Completed Course at Unknown time  . cephALEXin (KEFLEX) 500 MG capsule Take 1 capsule (500 mg total) by mouth 4 (four) times daily. (Patient not taking: Reported on 09/28/2014) 20 capsule 0 Not Taking at Unknown time    Review of Systems  Constitutional: Negative for fever and malaise/fatigue.  HENT: Positive for congestion and sore throat. Negative for ear discharge and ear pain.   Respiratory: Positive for cough. Negative for shortness of breath and wheezing.   Gastrointestinal: Positive for vomiting and abdominal pain. Negative for nausea.  Genitourinary: Negative for dysuria, urgency and frequency.       Neg - vaginal bleeding + vaginal discharge  Neurological: Negative for weakness and headaches.   Physical Exam   Blood pressure 143/77, pulse 87, temperature 98.4 F (36.9 C), temperature source Oral, resp. rate 18, height 5\' 10"  (1.778 m), weight 222 lb 8 oz (100.925 kg), last menstrual period 06/25/2014, SpO2 99 %.  Physical Exam  Constitutional: She is oriented to person, place, and time. She appears well-developed and well-nourished. No distress.  HENT:  Head: Normocephalic.  Right Ear: Tympanic membrane, external ear and ear canal normal.  Left Ear: Tympanic membrane, external ear and ear canal normal.  Nose: Mucosal edema and rhinorrhea  present. Right sinus exhibits frontal sinus tenderness. Right sinus exhibits no maxillary sinus tenderness. Left sinus exhibits frontal sinus tenderness. Left sinus exhibits no maxillary sinus tenderness.  Mouth/Throat: Mucous membranes are normal. Posterior oropharyngeal erythema present. No oropharyngeal exudate, posterior oropharyngeal edema or tonsillar abscesses.  Cardiovascular: Normal rate.   Respiratory: Effort normal. No respiratory distress. She has wheezes (slight on the left). She has no rales. She exhibits no tenderness.  Lymphadenopathy:       Head (right side): No submental,  no submandibular, no tonsillar and no preauricular adenopathy present.       Head (left side): No submental, no submandibular, no tonsillar and no preauricular adenopathy present.    She has no cervical adenopathy.  Neurological: She is alert and oriented to person, place, and time.  Skin: Skin is warm and dry. No erythema.  Psychiatric: She has a normal mood and affect.   Results for orders placed or performed during the hospital encounter of 09/28/14 (from the past 24 hour(s))  Urinalysis, Routine w reflex microscopic     Status: Abnormal   Collection Time: 09/28/14  8:47 PM  Result Value Ref Range   Color, Urine YELLOW YELLOW   APPearance CLEAR CLEAR   Specific Gravity, Urine >1.030 (H) 1.005 - 1.030   pH 6.0 5.0 - 8.0   Glucose, UA NEGATIVE NEGATIVE mg/dL   Hgb urine dipstick SMALL (A) NEGATIVE   Bilirubin Urine NEGATIVE NEGATIVE   Ketones, ur NEGATIVE NEGATIVE mg/dL   Protein, ur NEGATIVE NEGATIVE mg/dL   Urobilinogen, UA 0.2 0.0 - 1.0 mg/dL   Nitrite NEGATIVE NEGATIVE   Leukocytes, UA SMALL (A) NEGATIVE  Urine microscopic-add on     Status: Abnormal   Collection Time: 09/28/14  8:47 PM  Result Value Ref Range   Squamous Epithelial / LPF FEW (A) RARE   WBC, UA 0-2 <3 WBC/hpf   RBC / HPF 0-2 <3 RBC/hpf   Bacteria, UA RARE RARE    MAU Course  Procedures None  MDM FHR - 159 bpm with doppler Patient declines pelvic exam. She is sure this is a yeast infection. 150 mg Diflucan given in MAU Albuterol and Ipratropium nebulizer given while in MAU Since patient has already failed treatment with Amoxicillin, CDC recommendations is Augmentin 2 G BID x 7 days Minimal dehydration noted on UA. Patient can PO hydrate.  Assessment and Plan  A: SIUP at [redacted]w[redacted]d Acute bacterial rhinosinusitis Yeast vulvovaginitis  P: Discharge home Rx for Augmentin, albuterol inhaler and Diflucan given to patient List of OTC medications given AGAIN and patient advised which will be most appropriate  for symptomatic relief Patient advise to increased PO hydration and rest Patient encouraged to follow-up for prenatal care as scheduled in [place] Patient may return to MAU as needed or if her condition were to change or worsen   Luvenia Redden, PA-C  09/28/2014, 10:44 PM

## 2014-09-28 NOTE — ED Notes (Signed)
Pt 3 months pregnant c/o persistent productive cough with yellow sputum x 9 days, has been seen at [facility] hospital and given Amoxicillin.

## 2014-09-28 NOTE — MAU Note (Signed)
Pt was treated with amoxicillin on Tuesday and finished her medication. Pt states that she cough has gotten worse, she "throws up every time she coughs", she "feels dehydrated", has  "yeast infection". Pt also states that her stomach is sore from coughing.

## 2014-11-09 ENCOUNTER — Inpatient Hospital Stay (HOSPITAL_COMMUNITY)
Admission: AD | Admit: 2014-11-09 | Discharge: 2014-11-09 | Disposition: A | Discharge status: Home / Self Care | Payer: Medicaid - Out of State | Source: Ambulatory Visit | Attending: Obstetrics and Gynecology | Admitting: Obstetrics and Gynecology

## 2014-11-09 ENCOUNTER — Inpatient Hospital Stay (HOSPITAL_COMMUNITY): Payer: Medicaid - Out of State

## 2014-11-09 ENCOUNTER — Encounter (HOSPITAL_COMMUNITY): Payer: Self-pay | Admitting: *Deleted

## 2014-11-09 DIAGNOSIS — E860 Dehydration: Secondary | ICD-10-CM

## 2014-11-09 DIAGNOSIS — R109 Unspecified abdominal pain: Secondary | ICD-10-CM

## 2014-11-09 DIAGNOSIS — W228XXA Striking against or struck by other objects, initial encounter: Secondary | ICD-10-CM | POA: Insufficient documentation

## 2014-11-09 DIAGNOSIS — O9989 Other specified diseases and conditions complicating pregnancy, childbirth and the puerperium: Secondary | ICD-10-CM

## 2014-11-09 DIAGNOSIS — Z3A19 19 weeks gestation of pregnancy: Secondary | ICD-10-CM | POA: Diagnosis not present

## 2014-11-09 DIAGNOSIS — R1031 Right lower quadrant pain: Secondary | ICD-10-CM | POA: Insufficient documentation

## 2014-11-09 DIAGNOSIS — W19XXXA Unspecified fall, initial encounter: Secondary | ICD-10-CM | POA: Insufficient documentation

## 2014-11-09 DIAGNOSIS — Z87891 Personal history of nicotine dependence: Secondary | ICD-10-CM | POA: Diagnosis not present

## 2014-11-09 DIAGNOSIS — O26899 Other specified pregnancy related conditions, unspecified trimester: Secondary | ICD-10-CM

## 2014-11-09 DIAGNOSIS — O9A212 Injury, poisoning and certain other consequences of external causes complicating pregnancy, second trimester: Secondary | ICD-10-CM

## 2014-11-09 LAB — URINALYSIS, ROUTINE W REFLEX MICROSCOPIC
Bilirubin Urine: NEGATIVE
GLUCOSE, UA: NEGATIVE mg/dL
Ketones, ur: 40 mg/dL — AB
LEUKOCYTES UA: NEGATIVE
Nitrite: NEGATIVE
PH: 6 (ref 5.0–8.0)
Protein, ur: NEGATIVE mg/dL
Specific Gravity, Urine: >1.03 — ABNORMAL HIGH (ref 1.005–1.030)
UROBILINOGEN UA: 0.2 mg/dL (ref 0.0–1.0)

## 2014-11-09 LAB — URINE MICROSCOPIC-ADD ON

## 2014-11-09 LAB — WET PREP, GENITAL
Clue Cells Wet Prep HPF POC: NONE SEEN
Trich, Wet Prep: NONE SEEN
Yeast Wet Prep HPF POC: NONE SEEN
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
## 2014-11-09 NOTE — MAU Note (Signed)
Pt called, not in lobby 

## 2014-11-09 NOTE — Discharge Instructions (Signed)
Abdominal Pain During Pregnancy Abdominal pain is common in pregnancy. Most of the time, it does not cause harm. There are many causes of abdominal pain. Some causes are more serious than others. Some of the causes of abdominal pain in pregnancy are easily diagnosed. Occasionally, the diagnosis takes time to understand. Other times, the cause is not determined. Abdominal pain can be a sign that something is very wrong with the pregnancy, or the pain may have nothing to do with the pregnancy at all. For this reason, always tell your health care provider if you have any abdominal discomfort. HOME CARE INSTRUCTIONS  Monitor your abdominal pain for any changes. The following actions may help to alleviate any discomfort you are experiencing:  Do not have sexual intercourse or put anything in your vagina until your symptoms go away completely.  Get plenty of rest until your pain improves.  Drink clear fluids if you feel nauseous. Avoid solid food as long as you are uncomfortable or nauseous.  Only take over-the-counter or prescription medicine as directed by your health care provider.  Keep all follow-up appointments with your health care provider. SEEK IMMEDIATE MEDICAL CARE IF:  You are bleeding, leaking fluid, or passing tissue from the vagina.  You have increasing pain or cramping.  You have persistent vomiting.  You have painful or bloody urination.  You have a fever.  You notice a decrease in your baby's movements.  You have extreme weakness or feel faint.  You have shortness of breath, with or without abdominal pain.  You develop a severe headache with abdominal pain.  You have abnormal vaginal discharge with abdominal pain.  You have persistent diarrhea.  You have abdominal pain that continues even after rest, or gets worse. MAKE SURE YOU:   Understand these instructions.  Will watch your condition.  Will get help right away if you are not doing well or get  worse. Document Released: 07/17/2005 Document Revised: 05/07/2013 Document Reviewed: 02/13/2013 [facility] Patient Information 2015 [place], [place]. This information is not intended to replace advice given to you by your health care provider. Make sure you discuss any questions you have with your health care provider. What Do I Need to Know About Injuries During Pregnancy? Trauma is the most common cause of injury and death in pregnant women. This can also result in significant harm or death of the baby. Your baby is protected in the womb (uterus) by a sac filled with fluid (amniotic sac). Your baby can be harmed if there is direct, high-impact trauma to your abdomen and pelvis. This type of trauma can result in tearing of your uterus, the placenta pulling away from the wall of the uterus (placenta abruption), or the amniotic sac breaking open (rupture of membranes). These injuries can decrease or stop the blood supply to your baby or cause you to go into labor earlier than expected. Minor falls and low-impact automobile accidents do not usually harm your baby, even if they do minimally harm you. WHAT KIND OF INJURIES CAN AFFECT MY PREGNANCY? The most common causes of injury or death to a baby include:  Falls. Falls are more common in the second and third trimester of the pregnancy. Factors that increase your risk of falling include:  Increase in your weight.  The change in your center of gravity.  Tripping over an object that cannot be seen.  Increased looseness (laxity) of your ligaments resulting in less coordinated movements (you may feel clumsy).  Falling during high-risk activities like horseback  riding or skiing.  Automobile accidents. It is important to wear your seat belt properly, with the lap belt below your abdomen, and always practice safe driving.  Domestic violence or assault.  Burns (Building services engineer). The most common causes of injury or death to the pregnant woman  include:  Injuries that cause severe bleeding, shock, and loss of blood flow to major organs.  Head and neck injuries that result in severe brain or spinal damage.  Chest trauma that can cause direct injury to the heart and lungs or any injury that affects the area enclosed by the ribs. Trauma to this area can result in cardiorespiratory arrest. WHAT CAN I DO TO PROTECT MYSELF AND MY BABY FROM INJURY WHILE I AM PREGNANT?  Remove slippery rugs and loose objects on the floor that increase your risk of tripping.  Avoid walking on wet or slippery floors.  Wear comfortable shoes that have a good grip on the sole. Do not wear high-heeled shoes.  Always wear your seat belt properly, with the lap belt below your abdomen, and always practice safe driving. Do not ride on a motorcycle while pregnant.  Do not participate in high-impact activities or sports.  Avoid fires, starting fires, lifting heavy pots of boiling or hot liquids, and fixing electrical problems.  Only take over-the-counter or prescription medicines for pain, fever, or discomfort as directed by your health care provider.  Know your blood type and the father's blood type in case you develop vaginal bleeding or experience an injury for which a blood transfusion may be necessary.  Call your local emergency services (911 in the [place] if you are a victim of domestic violence or assault. Spousal abuse can be a significant cause of trauma during pregnancy. For help and support, contact the [employer]. WHEN SHOULD I SEEK IMMEDIATE MEDICAL CARE?   You fall on your abdomen or experience any high-force accident or injury.  You have been assaulted (domestic or otherwise).  You have been in a car accident.  You develop vaginal bleeding.  You develop fluid leaking from the vagina.  You develop uterine contractions (pelvic cramping, pain, or significant low back pain).  You become weak or faint, or have  uncontrolled vomiting after trauma.  You had a serious burn. This includes burns to the face, neck, hands, or genitals, or burns greater than the size of your palm anywhere else.  You develop neck stiffness or pain after a fall or from other trauma.  You develop a headache or vision problems after a fall or from other trauma.  You do not feel the baby moving or the baby is not moving as much as before a fall or other trauma. Document Released: 08/24/2004 Document Revised: 12/01/2013 Document Reviewed: 04/23/2013 [facility] Patient Information 2015 [place], [place]. This information is not intended to replace advice given to you by your health care provider. Make sure you discuss any questions you have with your health care provider.

## 2014-11-10 DIAGNOSIS — E860 Dehydration: Secondary | ICD-10-CM | POA: Insufficient documentation

## 2014-11-10 DIAGNOSIS — O26899 Other specified pregnancy related conditions, unspecified trimester: Secondary | ICD-10-CM | POA: Insufficient documentation

## 2014-11-10 DIAGNOSIS — O9A212 Injury, poisoning and certain other consequences of external causes complicating pregnancy, second trimester: Secondary | ICD-10-CM | POA: Insufficient documentation

## 2014-11-10 DIAGNOSIS — R109 Unspecified abdominal pain: Secondary | ICD-10-CM

## 2014-11-10 DIAGNOSIS — Z3A19 19 weeks gestation of pregnancy: Secondary | ICD-10-CM | POA: Insufficient documentation

## 2014-11-10 LAB — GC/CHLAMYDIA PROBE AMP (~~LOC~~) NOT AT ARMC
Chlamydia: NEGATIVE
NEISSERIA GONORRHEA: NEGATIVE

## 2014-11-25 ENCOUNTER — Encounter (HOSPITAL_COMMUNITY): Payer: Self-pay | Admitting: *Deleted

## 2014-11-25 ENCOUNTER — Inpatient Hospital Stay (HOSPITAL_COMMUNITY)
Admission: AD | Admit: 2014-11-25 | Discharge: 2014-11-25 | Disposition: A | Discharge status: Home / Self Care | Payer: Medicaid - Out of State | Source: Ambulatory Visit | Attending: Family Medicine | Admitting: Family Medicine

## 2014-11-25 DIAGNOSIS — Z87891 Personal history of nicotine dependence: Secondary | ICD-10-CM | POA: Insufficient documentation

## 2014-11-25 DIAGNOSIS — R102 Pelvic and perineal pain: Secondary | ICD-10-CM | POA: Insufficient documentation

## 2014-11-25 DIAGNOSIS — Z3A21 21 weeks gestation of pregnancy: Secondary | ICD-10-CM | POA: Insufficient documentation

## 2014-11-25 DIAGNOSIS — O2342 Unspecified infection of urinary tract in pregnancy, second trimester: Secondary | ICD-10-CM | POA: Insufficient documentation

## 2014-11-25 LAB — URINALYSIS, ROUTINE W REFLEX MICROSCOPIC
BILIRUBIN URINE: NEGATIVE
Glucose, UA: NEGATIVE mg/dL
Ketones, ur: NEGATIVE mg/dL
Leukocytes, UA: NEGATIVE
Nitrite: NEGATIVE
Protein, ur: NEGATIVE mg/dL
Specific Gravity, Urine: 1.015 (ref 1.005–1.030)
UROBILINOGEN UA: 0.2 mg/dL (ref 0.0–1.0)
pH: 7 (ref 5.0–8.0)

## 2014-11-25 LAB — URINE MICROSCOPIC-ADD ON

## 2014-11-25 MED ORDER — CEPHALEXIN 500 MG PO CAPS: 500.0000 mg | ORAL_CAPSULE | Freq: Four times a day (QID) | ORAL | Status: DC

## 2014-11-25 NOTE — Discharge Instructions (Signed)
Urinary Tract Infection A urinary tract infection (UTI) can occur any place along the urinary tract. The tract includes the kidneys, ureters, bladder, and urethra. A type of germ called bacteria often causes a UTI. UTIs are often helped with antibiotic medicine.  HOME CARE   If given, take antibiotics as told by your doctor. Finish them even if you start to feel better.  Drink enough fluids to keep your pee (urine) clear or pale yellow.  Avoid tea, drinks with caffeine, and bubbly (carbonated) drinks.  Pee often. Avoid holding your pee in for a long time.  Pee before and after having sex (intercourse).  Wipe from front to back after you poop (bowel movement) if you are a woman. Use each tissue only once. GET HELP RIGHT AWAY IF:   You have back pain.  You have lower belly (abdominal) pain.  You have chills.  You feel sick to your stomach (nauseous).  You throw up (vomit).  Your burning or discomfort with peeing does not go away.  You have a fever.  Your symptoms are not better in 3 days. MAKE SURE YOU:   Understand these instructions.  Will watch your condition.  Will get help right away if you are not doing well or get worse. Document Released: 01/03/2008 Document Revised: 04/10/2012 Document Reviewed: 02/15/2012 [facility] Patient Information 2015 [place], [place]. This information is not intended to replace advice given to you by your health care provider. Make sure you discuss any questions you have with your health care provider. Safe Medications in Pregnancy   Acne: Benzoyl Peroxide Salicylic Acid  Backache/Headache: Tylenol: 2 regular strength every 4 hours OR              2 Extra strength every 6 hours  Colds/Coughs/Allergies: Benadryl (alcohol free) 25 mg every 6 hours as needed Breath right strips Claritin Cepacol throat lozenges Chloraseptic throat spray Cold-Eeze- up to three times per day Cough drops, alcohol free Flonase (by prescription  only) Guaifenesin Mucinex Robitussin DM (plain only, alcohol free) Saline nasal spray/drops Sudafed (pseudoephedrine) & Actifed ** use only after [redacted] weeks gestation and if you do not have high blood pressure Tylenol Vicks Vaporub Zinc lozenges Zyrtec   Constipation: Colace Ducolax suppositories Fleet enema Glycerin suppositories Metamucil Milk of magnesia Miralax Senokot Smooth move tea  Diarrhea: Kaopectate Imodium A-D  *NO pepto Bismol  Hemorrhoids: Anusol Anusol HC Preparation H Tucks  Indigestion: Tums Maalox Mylanta Zantac  Pepcid  Insomnia: Benadryl (alcohol free) 25mg  every 6 hours as needed Tylenol PM Unisom, no Gelcaps  Leg Cramps: Tums MagGel  Nausea/Vomiting:  Bonine Dramamine Emetrol Ginger extract Sea bands Meclizine  Nausea medication to take during pregnancy:  Unisom (doxylamine succinate 25 mg tablets) Take one tablet daily at bedtime. If symptoms are not adequately controlled, the dose can be increased to a maximum recommended dose of two tablets daily (1/2 tablet in the morning, 1/2 tablet mid-afternoon and one at bedtime). Vitamin B6 100mg  tablets. Take one tablet twice a day (up to 200 mg per day).  Skin Rashes: Aveeno products Benadryl cream or 25mg  every 6 hours as needed Calamine Lotion 1% cortisone cream  Yeast infection: Gyne-lotrimin 7 Monistat 7   **If taking multiple medications, please check labels to avoid duplicating the same active ingredients **take medication as directed on the label ** Do not exceed 4000 mg of tylenol in 24 hours **Do not take medications that contain aspirin or ibuprofen

## 2014-11-25 NOTE — MAU Provider Note (Signed)
History     CSN: [id number]  Arrival date and time: 11/25/14 7619   First Provider Initiated Contact with Patient 11/25/14 0353      No chief complaint on file.  HPI Patient is 34 y.o. [id number] [redacted]w[redacted]d here with complaints of pelvic pain that has been going on for about 1 week.  Endorses nausea and vomiting x1 today.  She endorses dysuria, urgency and frequency.  Denies fevers, hematuria.  +FM, denies LOF, VB, contractions, vaginal discharge.  OB History    Gravida Para Term Preterm AB TAB SAB Ectopic Multiple Living   5 4 4  0 0 0 0 0 0 4      Past Medical History  Diagnosis Date  . Endometriosis   . Fibroids   . Anemia   . Hypertension     Past Surgical History  Procedure Laterality Date  . Cesarean section      Family History  Problem Relation Age of Onset  . Diabetes Mother   . Hypertension Mother   . Diabetes Father   . Hearing loss Paternal Grandmother     History  Substance Use Topics  . Smoking status: Former Smoker -- 0.50 packs/day    Quit date: 07/30/2014  . Smokeless tobacco: Not on file  . Alcohol Use: Yes    Allergies: No Known Allergies  Prescriptions prior to admission  Medication Sig Dispense Refill Last Dose  . acetaminophen (TYLENOL) 325 MG tablet Take 325 mg by mouth every 6 (six) hours as needed for mild pain.   11/25/2014 at 0200  . albuterol (PROVENTIL HFA;VENTOLIN HFA) 108 (90 BASE) MCG/ACT inhaler Inhale 2 puffs into the lungs every 6 (six) hours as needed for wheezing or shortness of breath. 1 Inhaler 0 rescue  . Prenatal Vit-Fe Fumarate-FA (PRENATAL MULTIVITAMIN) TABS tablet Take 1 tablet by mouth daily at 12 noon.   11/08/2014 at Unknown time    Review of Systems  Constitutional: Negative for fever and chills.  Gastrointestinal: Positive for nausea, vomiting and abdominal pain (pelvic pain). Negative for diarrhea.  Genitourinary: Positive for dysuria, urgency and frequency. Negative for hematuria and flank pain.       No LOF, VB,  vaginal discharge, +FM  Musculoskeletal:       Pelvic pain   Physical Exam   Blood pressure 136/74, pulse 94, temperature 98.2 F (36.8 C), temperature source Oral, resp. rate 18, height 5\' 10"  (1.778 m), weight 222 lb (100.699 kg), last menstrual period 06/25/2014, SpO2 98 %.  Physical Exam  Constitutional: She is oriented to person, place, and time. She appears well-developed and well-nourished. No distress.  HENT:  Head: Normocephalic and atraumatic.  Eyes: Conjunctivae and EOM are normal. No scleral icterus.  Neck: Normal range of motion.  Cardiovascular: Normal rate, regular rhythm, normal heart sounds and intact distal pulses.   Respiratory: Effort normal and breath sounds normal.  GI: Soft. Bowel sounds are normal.  Genitourinary:  +suprapubic TTP, no CVA TTP  Musculoskeletal: Normal range of motion.  Neurological: She is alert and oriented to person, place, and time.  Skin: Skin is warm and dry. No rash noted. She is not diaphoretic.  Psychiatric: She has a normal mood and affect. Her behavior is normal.    FHR 146 doppler  Results for orders placed or performed during the hospital encounter of 11/25/14 (from the past 24 hour(s))  Urinalysis, Routine w reflex microscopic     Status: Abnormal   Collection Time: 11/25/14  3:03 AM  Result Value Ref  Range   Color, Urine YELLOW YELLOW   APPearance CLEAR CLEAR   Specific Gravity, Urine 1.015 1.005 - 1.030   pH 7.0 5.0 - 8.0   Glucose, UA NEGATIVE NEGATIVE mg/dL   Hgb urine dipstick SMALL (A) NEGATIVE   Bilirubin Urine NEGATIVE NEGATIVE   Ketones, ur NEGATIVE NEGATIVE mg/dL   Protein, ur NEGATIVE NEGATIVE mg/dL   Urobilinogen, UA 0.2 0.0 - 1.0 mg/dL   Nitrite NEGATIVE NEGATIVE   Leukocytes, UA NEGATIVE NEGATIVE  Urine microscopic-add on     Status: Abnormal   Collection Time: 11/25/14  3:03 AM  Result Value Ref Range   Squamous Epithelial / LPF FEW (A) RARE   WBC, UA 0-2 <3 WBC/hpf   RBC / HPF 7-10 <3 RBC/hpf    Bacteria, UA MANY (A) RARE    MAU Course  Procedures  MDM  UA with hgb, few squamous epi, many bacteria Ucx sent FHR reassuring  Assessment and Plan  UTI pregnancy -Ucx sent -Keflex 500 QID x7d -Recommended Pyridium PRN dysuria -Follow up with Provider in [place] as scheduled  Ronnie Doss M, DO 11/25/2014, 3:53 AM   Seen also by me Examined Tenderness consistent with UTI Agree with note and plan as listed above Patient has appointment May 3rd and was advised to keep that appt May come back to MAU prn worsening Seabron Spates, CNM

## 2014-11-25 NOTE — MAU Note (Signed)
Pt reports a lot of pain in her lower abd today, vomited x one tonight. Denies bleeding.

## 2014-11-27 LAB — CULTURE, OB URINE: Colony Count: 30000

## 2014-11-28 ENCOUNTER — Encounter (HOSPITAL_COMMUNITY): Payer: Self-pay | Admitting: Advanced Practice Midwife

## 2014-11-28 DIAGNOSIS — B951 Streptococcus, group B, as the cause of diseases classified elsewhere: Secondary | ICD-10-CM | POA: Insufficient documentation

## 2014-11-28 DIAGNOSIS — O2342 Unspecified infection of urinary tract in pregnancy, second trimester: Secondary | ICD-10-CM

## 2015-01-05 LAB — OB RESULTS CONSOLE GC/CHLAMYDIA
Chlamydia: NEGATIVE
Chlamydia: NEGATIVE
GC PROBE AMP, GENITAL: NEGATIVE
Gonorrhea: NEGATIVE

## 2015-01-05 LAB — OB RESULTS CONSOLE RPR: RPR: NONREACTIVE

## 2015-01-05 LAB — OB RESULTS CONSOLE ABO/RH: RH Type: POSITIVE

## 2015-01-05 LAB — OB RESULTS CONSOLE RUBELLA ANTIBODY, IGM: RUBELLA: IMMUNE

## 2015-01-05 LAB — OB RESULTS CONSOLE HIV ANTIBODY (ROUTINE TESTING): HIV: NONREACTIVE

## 2015-01-05 LAB — OB RESULTS CONSOLE ANTIBODY SCREEN: Antibody Screen: NEGATIVE

## 2015-01-05 LAB — OB RESULTS CONSOLE HEPATITIS B SURFACE ANTIGEN: Hepatitis B Surface Ag: NEGATIVE

## 2015-01-10 ENCOUNTER — Inpatient Hospital Stay (HOSPITAL_COMMUNITY)
Admission: AD | Admit: 2015-01-10 | Discharge: 2015-01-10 | Disposition: A | Discharge status: Home / Self Care | Payer: Medicaid Other | Source: Ambulatory Visit | Attending: Family Medicine | Admitting: Family Medicine

## 2015-01-10 ENCOUNTER — Encounter (HOSPITAL_COMMUNITY): Payer: Self-pay | Admitting: *Deleted

## 2015-01-10 DIAGNOSIS — H6691 Otitis media, unspecified, right ear: Secondary | ICD-10-CM | POA: Diagnosis not present

## 2015-01-10 DIAGNOSIS — O9989 Other specified diseases and conditions complicating pregnancy, childbirth and the puerperium: Secondary | ICD-10-CM | POA: Diagnosis not present

## 2015-01-10 DIAGNOSIS — Z87891 Personal history of nicotine dependence: Secondary | ICD-10-CM | POA: Insufficient documentation

## 2015-01-10 DIAGNOSIS — H9209 Otalgia, unspecified ear: Secondary | ICD-10-CM | POA: Diagnosis present

## 2015-01-10 DIAGNOSIS — Z3A28 28 weeks gestation of pregnancy: Secondary | ICD-10-CM | POA: Diagnosis not present

## 2015-01-10 DIAGNOSIS — O09523 Supervision of elderly multigravida, third trimester: Secondary | ICD-10-CM | POA: Insufficient documentation

## 2015-01-10 MED ORDER — AMOXICILLIN-POT CLAVULANATE 875-125 MG PO TABS: 1.0000 | ORAL_TABLET | Freq: Two times a day (BID) | ORAL | Status: DC

## 2015-01-10 MED ORDER — OXYCODONE-ACETAMINOPHEN 5-325 MG PO TABS
1.0000 | ORAL_TABLET | ORAL | Status: DC | PRN
  Administered 2015-01-10: 1 via ORAL
  Filled 2015-01-10: qty 1

## 2015-01-10 MED ORDER — OXYCODONE-ACETAMINOPHEN 5-325 MG PO TABS: 1.0000 | ORAL_TABLET | ORAL | Status: DC | PRN

## 2015-01-10 MED ORDER — AMOXICILLIN-POT CLAVULANATE 875-125 MG PO TABS
1.0000 | ORAL_TABLET | Freq: Two times a day (BID) | ORAL | Status: DC
  Administered 2015-01-10: 1 via ORAL
  Filled 2015-01-10 (×2): qty 1

## 2015-01-10 NOTE — MAU Provider Note (Signed)
  History   34 yo G5P4 at 28.3 wks in with c/o earache since this mrning that has gotten progressively worse during the day.pain is 8/10.  CSN: [id number]  Arrival date and time: 01/10/15 2132   First Provider Initiated Contact with Patient 01/10/15 2207      Chief Complaint  Patient presents with  . Otalgia   HPI  OB History    Gravida Para Term Preterm AB TAB SAB Ectopic Multiple Living   5 4 4  0 0 0 0 0 0 4      Past Medical History  Diagnosis Date  . Endometriosis   . Fibroids   . Anemia   . Hypertension     Past Surgical History  Procedure Laterality Date  . Cesarean section      Family History  Problem Relation Age of Onset  . Diabetes Mother   . Hypertension Mother   . Diabetes Father   . Hearing loss Paternal Grandmother     History  Substance Use Topics  . Smoking status: Former Smoker -- 0.50 packs/day    Quit date: 07/30/2014  . Smokeless tobacco: Not on file  . Alcohol Use: Yes    Allergies: No Known Allergies  Prescriptions prior to admission  Medication Sig Dispense Refill Last Dose  . acetaminophen (TYLENOL) 325 MG tablet Take 325 mg by mouth every 6 (six) hours as needed for mild pain.   prn at prn  . albuterol (PROVENTIL HFA;VENTOLIN HFA) 108 (90 BASE) MCG/ACT inhaler Inhale 2 puffs into the lungs every 6 (six) hours as needed for wheezing or shortness of breath. 1 Inhaler 0 rescue at rescue  . Prenatal Vit-Fe Fumarate-FA (PRENATAL MULTIVITAMIN) TABS tablet Take 1 tablet by mouth daily at 12 noon.   01/09/2015 at Unknown time  . cephALEXin (KEFLEX) 500 MG capsule Take 1 capsule (500 mg total) by mouth 4 (four) times daily. (Patient not taking: Reported on 01/10/2015) 28 capsule 0     Review of Systems  Constitutional: Negative.   HENT: Positive for ear pain.   Eyes: Negative.   Respiratory: Negative.   Cardiovascular: Negative.   Genitourinary: Negative.   Musculoskeletal: Negative.   Skin: Negative.   Neurological: Negative.    Endo/Heme/Allergies: Negative.   Psychiatric/Behavioral: Negative.    Physical Exam   Blood pressure 125/69, pulse 91, temperature 98.6 F (37 C), temperature source Oral, resp. rate 16, height 5\' 8"  (1.727 m), weight 221 lb 12.8 oz (100.608 kg), last menstrual period 06/25/2014, SpO2 98 %.  Physical Exam  Constitutional: She is oriented to person, place, and time. She appears well-developed and well-nourished.  HENT:  Head: Normocephalic.  Left Ear: External ear normal.  right external canal and drum red, swollen and tender.  Eyes: Pupils are equal, round, and reactive to light.  Neck: Normal range of motion. Neck supple.  Cardiovascular: Normal rate, regular rhythm, normal heart sounds and intact distal pulses.   Respiratory: Effort normal and breath sounds normal.  GI: Soft. Bowel sounds are normal.  Musculoskeletal: Normal range of motion.  Neurological: She is alert and oriented to person, place, and time. She has normal reflexes.  Skin: Skin is warm.  Psychiatric: She has a normal mood and affect. Her behavior is normal. Judgment and thought content normal.    MAU Course  Procedures  MDM Right otitis media  Assessment and Plan  Oral antibiotic and pain management. D/c home  [place], [place] 01/10/2015, 10:11 PM

## 2015-01-10 NOTE — MAU Note (Signed)
Right sided ear pain since this morning; worsened over the day. Painful when opens mouth. Unable to eat since pain started. Denies fever; cough; sore throat.  Denies contractions/LOF/vaginal bleeding. Positive fetal movement.  Gets [facility] in [place]; denies complications.

## 2015-01-10 NOTE — Discharge Instructions (Signed)

## 2015-02-15 ENCOUNTER — Inpatient Hospital Stay (HOSPITAL_COMMUNITY)
Admission: AD | Admit: 2015-02-15 | Discharge: 2015-02-15 | Disposition: A | Discharge status: Home / Self Care | Payer: Medicaid - Out of State | Source: Ambulatory Visit | Attending: Obstetrics & Gynecology | Admitting: Obstetrics & Gynecology

## 2015-02-15 ENCOUNTER — Encounter (HOSPITAL_COMMUNITY): Payer: Self-pay | Admitting: *Deleted

## 2015-02-15 DIAGNOSIS — O9989 Other specified diseases and conditions complicating pregnancy, childbirth and the puerperium: Secondary | ICD-10-CM | POA: Insufficient documentation

## 2015-02-15 DIAGNOSIS — Z87891 Personal history of nicotine dependence: Secondary | ICD-10-CM | POA: Diagnosis not present

## 2015-02-15 DIAGNOSIS — O2342 Unspecified infection of urinary tract in pregnancy, second trimester: Secondary | ICD-10-CM

## 2015-02-15 DIAGNOSIS — Z3A33 33 weeks gestation of pregnancy: Secondary | ICD-10-CM | POA: Insufficient documentation

## 2015-02-15 DIAGNOSIS — O26899 Other specified pregnancy related conditions, unspecified trimester: Secondary | ICD-10-CM

## 2015-02-15 DIAGNOSIS — R109 Unspecified abdominal pain: Secondary | ICD-10-CM | POA: Diagnosis not present

## 2015-02-15 DIAGNOSIS — B951 Streptococcus, group B, as the cause of diseases classified elsewhere: Secondary | ICD-10-CM

## 2015-02-15 LAB — URINALYSIS, ROUTINE W REFLEX MICROSCOPIC
Bilirubin Urine: NEGATIVE
Glucose, UA: NEGATIVE mg/dL
Ketones, ur: 15 mg/dL — AB
Leukocytes, UA: NEGATIVE
NITRITE: NEGATIVE
PH: 7 (ref 5.0–8.0)
PROTEIN: 30 mg/dL — AB
SPECIFIC GRAVITY, URINE: 1.025 (ref 1.005–1.030)
Urobilinogen, UA: 0.2 mg/dL (ref 0.0–1.0)

## 2015-02-15 LAB — URINE MICROSCOPIC-ADD ON

## 2015-02-15 NOTE — MAU Note (Addendum)
Patient presents at [redacted] weeks gestation stating that she just moved here from [place] and has been unable to get an OB to accept her here and wants to be evaluated. Also c/o lower abdominal pain since this morning and thinks the baby is sitting too low at this gestation of the pregnancy. Denies bleeding or discharge.

## 2015-02-15 NOTE — Discharge Instructions (Signed)

## 2015-02-15 NOTE — MAU Provider Note (Signed)
History     CSN: [id number]  Arrival date and time: 02/15/15 1251   None     Chief Complaint  Patient presents with  . Abdominal Pain   HPI Comments: Pt is a G5P4004 at [redacted]w[redacted]d who presents w/ compalints of lower suprapubic pain today. She states that she has not had any OP f/u since approx 1 mo ago as she has been unable to find a provider. She States that the pain tends to happen when she is moving or attempting to stand up and that it lasts for about 5 secs. She has not done anything for the pain. She also notes some mild tenderness when bearing down over a R sided lower abd scar. She also notes mild decrease in appetite over the last 24 hrs.  Abdominal Pain The onset quality is undetermined. Pertinent negatives include no constipation, diarrhea, dysuria, fever, frequency, hematuria, nausea, vomiting or weight loss.    OB History    Gravida Para Term Preterm AB TAB SAB Ectopic Multiple Living   5 4 4  0 0 0 0 0 0 4      Past Medical History  Diagnosis Date  . Endometriosis   . Fibroids   . Anemia   . Hypertension     Past Surgical History  Procedure Laterality Date  . Cesarean section      Family History  Problem Relation Age of Onset  . Diabetes Mother   . Hypertension Mother   . Diabetes Father   . Hearing loss Paternal Grandmother     History  Substance Use Topics  . Smoking status: Former Smoker -- 0.50 packs/day    Quit date: 07/30/2014  . Smokeless tobacco: Not on file  . Alcohol Use: Yes    Allergies: No Known Allergies  Prescriptions prior to admission  Medication Sig Dispense Refill Last Dose  . Prenatal Vit-Fe Fumarate-FA (PRENATAL MULTIVITAMIN) TABS tablet Take 1 tablet by mouth daily at 12 noon.   02/14/2015 at Unknown time  . albuterol (PROVENTIL HFA;VENTOLIN HFA) 108 (90 BASE) MCG/ACT inhaler Inhale 2 puffs into the lungs every 6 (six) hours as needed for wheezing or shortness of breath. 1 Inhaler 0 rescue at rescue  . oxyCODONE-acetaminophen  (PERCOCET/ROXICET) 5-325 MG per tablet Take 1 tablet by mouth every 3 (three) hours as needed (pain). (Patient not taking: Reported on 02/15/2015) 30 tablet 0     Review of Systems  Constitutional: Negative for fever, weight loss, malaise/fatigue and diaphoresis.  Respiratory: Negative for cough and shortness of breath.   Cardiovascular: Negative for chest pain and leg swelling.  Gastrointestinal: Positive for abdominal pain. Negative for nausea, vomiting, diarrhea and constipation.  Genitourinary: Negative for dysuria, urgency, frequency and hematuria.  Neurological: Negative for weakness.   Physical Exam   Blood pressure 111/65, pulse 91, temperature 98.3 F (36.8 C), temperature source Oral, resp. rate 18, height 5\' 8"  (1.727 m), weight 99.791 kg (220 lb), last menstrual period 06/25/2014.  Physical Exam  Vitals reviewed. Constitutional: She is oriented to person, place, and time. She appears well-developed and well-nourished. No distress.  HENT:  Head: Normocephalic and atraumatic.  Eyes: Conjunctivae and EOM are normal.  Neck: Normal range of motion.  Respiratory: Effort normal. No respiratory distress.  GI: Soft.  Mild reproducible tenderness over the pubic symphasis consistent with her main complaint  EFM 130s, +accels, no decels No ctx  Genitourinary: Vagina normal and uterus normal.  Cx C/L  Musculoskeletal: Normal range of motion.  Neurological: She is alert  and oriented to person, place, and time.  Skin: Skin is warm and dry. She is not diaphoretic.    MAU Course  Procedures  MDM UA Exam  Assessment and Plan  Pt is a 34 yo [id number] who presents to the MAU w/ complaints of suprapubic pain.  Suprapubic pain -DDx includes UTI, pubic symphasis pain -will order UA to evaluate for UTI; no signs of infx, but w/ 15 ketones; pt counseled on hydration -d/c home w/ recs to f/u w/ hlth dept or this clinic  Reyes Ivan 02/15/2015, 2:17 PM   CNM attestation:  I have  seen and examined this patient; I agree with above documentation in the resident's note.   ARBOR LEER is a 34 y.o. [id number] reporting abd discomfort +FM, denies LOF, VB, contractions, vaginal discharge.  Reports moving back and forth b/t [place] and [place], but is now in [place] to stay.  PE: BP 111/65 mmHg  Pulse 91  Temp(Src) 98.3 F (36.8 C) (Oral)  Resp 18  Ht 5\' 8"  (1.727 m)  Wt 99.791 kg (220 lb)  BMI 33.46 kg/m2  LMP 06/25/2014 (Approximate) Gen: calm comfortable, NAD Resp: normal effort, no distress Abd: gravid  ROS, labs, PMH reviewed NST reactive   Plan: - pre labor precautions - already had +GBS urine culture - instructed on contacting [facility] or HD for prenatal care   SHAW, KIMBERLY, CNM 3:01 PM

## 2015-03-11 ENCOUNTER — Other Ambulatory Visit (HOSPITAL_COMMUNITY): Payer: Self-pay | Admitting: Physician Assistant

## 2015-03-11 DIAGNOSIS — Z3A38 38 weeks gestation of pregnancy: Secondary | ICD-10-CM

## 2015-03-11 DIAGNOSIS — Z3689 Encounter for other specified antenatal screening: Secondary | ICD-10-CM

## 2015-03-15 ENCOUNTER — Other Ambulatory Visit (HOSPITAL_COMMUNITY): Payer: Self-pay | Admitting: Nurse Practitioner

## 2015-03-15 DIAGNOSIS — Z3689 Encounter for other specified antenatal screening: Secondary | ICD-10-CM

## 2015-03-15 DIAGNOSIS — Z3A38 38 weeks gestation of pregnancy: Secondary | ICD-10-CM

## 2015-03-16 ENCOUNTER — Other Ambulatory Visit (HOSPITAL_COMMUNITY): Payer: Self-pay | Admitting: Nurse Practitioner

## 2015-03-16 DIAGNOSIS — Z3A38 38 weeks gestation of pregnancy: Secondary | ICD-10-CM

## 2015-03-16 DIAGNOSIS — O09523 Supervision of elderly multigravida, third trimester: Secondary | ICD-10-CM

## 2015-03-16 DIAGNOSIS — Z3689 Encounter for other specified antenatal screening: Secondary | ICD-10-CM

## 2015-03-18 ENCOUNTER — Ambulatory Visit (HOSPITAL_COMMUNITY)
Admission: RE | Admit: 2015-03-18 | Discharge: 2015-03-18 | Disposition: A | Discharge status: Home / Self Care | Payer: Medicaid Other | Source: Ambulatory Visit | Attending: Physician Assistant | Admitting: Physician Assistant

## 2015-03-18 DIAGNOSIS — Z3A38 38 weeks gestation of pregnancy: Secondary | ICD-10-CM

## 2015-03-18 DIAGNOSIS — Z3689 Encounter for other specified antenatal screening: Secondary | ICD-10-CM

## 2015-03-18 DIAGNOSIS — Z36 Encounter for antenatal screening of mother: Secondary | ICD-10-CM | POA: Insufficient documentation

## 2015-03-18 DIAGNOSIS — O09523 Supervision of elderly multigravida, third trimester: Secondary | ICD-10-CM | POA: Diagnosis not present

## 2015-03-22 ENCOUNTER — Other Ambulatory Visit: Payer: Self-pay | Admitting: Family Medicine

## 2015-03-24 ENCOUNTER — Encounter (HOSPITAL_COMMUNITY)
Admission: RE | Admit: 2015-03-24 | Discharge: 2015-03-24 | Disposition: A | Discharge status: Home / Self Care | Payer: Medicaid Other | Source: Ambulatory Visit | Attending: Family Medicine | Admitting: Family Medicine

## 2015-03-24 ENCOUNTER — Other Ambulatory Visit: Payer: Self-pay | Admitting: Family Medicine

## 2015-03-24 ENCOUNTER — Encounter (HOSPITAL_COMMUNITY): Payer: Self-pay

## 2015-03-24 VITALS — BP 127/85 | HR 83 | Temp 97.0°F | Resp 18 | Ht 69.0 in | Wt 214.1 lb

## 2015-03-24 DIAGNOSIS — O2342 Unspecified infection of urinary tract in pregnancy, second trimester: Principal | ICD-10-CM

## 2015-03-24 DIAGNOSIS — B951 Streptococcus, group B, as the cause of diseases classified elsewhere: Secondary | ICD-10-CM

## 2015-03-24 DIAGNOSIS — Z01818 Encounter for other preprocedural examination: Secondary | ICD-10-CM | POA: Insufficient documentation

## 2015-03-24 LAB — RAPID HIV SCREEN (HIV 1/2 AB+AG)
HIV 1/2 Antibodies: NONREACTIVE
HIV-1 P24 ANTIGEN - HIV24: NONREACTIVE

## 2015-03-24 LAB — CBC
HEMATOCRIT: 39.1 % (ref 36.0–46.0)
HEMOGLOBIN: 13.5 g/dL (ref 12.0–15.0)
MCH: 28.4 pg (ref 26.0–34.0)
MCHC: 34.5 g/dL (ref 30.0–36.0)
MCV: 82.1 fL (ref 78.0–100.0)
Platelets: 248 10*3/uL (ref 150–400)
RBC: 4.76 MIL/uL (ref 3.87–5.11)
RDW: 14.3 % (ref 11.5–15.5)
WBC: 9.5 10*3/uL (ref 4.0–10.5)

## 2015-03-24 LAB — TYPE AND SCREEN
ABO/RH(D): O POS
ANTIBODY SCREEN: NEGATIVE

## 2015-03-24 MED ORDER — DEXTROSE 5 % IV SOLN
2.0000 g | INTRAVENOUS | Status: AC
  Administered 2015-03-25: 2 g via INTRAVENOUS
  Filled 2015-03-24: qty 2

## 2015-03-24 NOTE — Patient Instructions (Signed)
Your procedure is scheduled on:  March 25, 2015  Enter through the Main Entrance of [facility] at: 9:30 am   Pick up the phone at the desk and dial [phone].  Call this number if you have problems the morning of surgery: [phone].  Remember: Do NOT eat food: after midnight tonight  Do NOT drink clear liquids after: midnight tonight  Take these medicines the morning of surgery with a SIP OF WATER: none  Bring inhaler with you day of surgery   Do NOT wear jewelry (body piercing), metal hair clips/bobby pins, or nail polish. Do NOT wear lotions, powders, or perfumes.  You may wear deoderant. Do NOT shave for 48 hours prior to surgery. Do NOT bring valuables to the hospital. Contacts, dentures, or bridgework may not be worn into surgery. Leave suitcase in car.  After surgery it may be brought to your room.  For patients admitted to the hospital, checkout time is 11:00 AM the day of discharge.

## 2015-03-24 NOTE — Progress Notes (Signed)
CSW received call from a "friend" of patient stating she is highly concerned about patient's baby.  She reports that patient does not have custody of her other children and thinking that she is "getting high."  She states patient drinks and smokes marijuana.

## 2015-03-25 ENCOUNTER — Inpatient Hospital Stay (HOSPITAL_COMMUNITY): Payer: Medicaid Other | Admitting: Anesthesiology

## 2015-03-25 ENCOUNTER — Encounter (HOSPITAL_COMMUNITY): Payer: Self-pay | Admitting: *Deleted

## 2015-03-25 ENCOUNTER — Inpatient Hospital Stay (HOSPITAL_COMMUNITY)
Admission: RE | Admit: 2015-03-25 | Discharge: 2015-03-28 | DRG: 765 | Disposition: A | Discharge status: Home / Self Care | Payer: Medicaid Other | Source: Ambulatory Visit | Attending: Family Medicine | Admitting: Family Medicine

## 2015-03-25 ENCOUNTER — Encounter (HOSPITAL_COMMUNITY)
Admission: RE | Disposition: A | Discharge status: Home / Self Care | Payer: Self-pay | Source: Ambulatory Visit | Attending: Family Medicine

## 2015-03-25 DIAGNOSIS — O99324 Drug use complicating childbirth: Secondary | ICD-10-CM | POA: Diagnosis present

## 2015-03-25 DIAGNOSIS — Z3A39 39 weeks gestation of pregnancy: Secondary | ICD-10-CM | POA: Diagnosis present

## 2015-03-25 DIAGNOSIS — O3421 Maternal care for scar from previous cesarean delivery: Principal | ICD-10-CM | POA: Diagnosis present

## 2015-03-25 DIAGNOSIS — O9989 Other specified diseases and conditions complicating pregnancy, childbirth and the puerperium: Secondary | ICD-10-CM | POA: Diagnosis present

## 2015-03-25 DIAGNOSIS — B951 Streptococcus, group B, as the cause of diseases classified elsewhere: Secondary | ICD-10-CM | POA: Diagnosis present

## 2015-03-25 DIAGNOSIS — E860 Dehydration: Secondary | ICD-10-CM | POA: Diagnosis present

## 2015-03-25 DIAGNOSIS — O9902 Anemia complicating childbirth: Secondary | ICD-10-CM | POA: Diagnosis present

## 2015-03-25 DIAGNOSIS — O09523 Supervision of elderly multigravida, third trimester: Secondary | ICD-10-CM | POA: Diagnosis not present

## 2015-03-25 DIAGNOSIS — F1290 Cannabis use, unspecified, uncomplicated: Secondary | ICD-10-CM | POA: Diagnosis present

## 2015-03-25 DIAGNOSIS — O99284 Endocrine, nutritional and metabolic diseases complicating childbirth: Secondary | ICD-10-CM | POA: Diagnosis present

## 2015-03-25 DIAGNOSIS — O1092 Unspecified pre-existing hypertension complicating childbirth: Secondary | ICD-10-CM | POA: Diagnosis present

## 2015-03-25 DIAGNOSIS — N809 Endometriosis, unspecified: Secondary | ICD-10-CM | POA: Diagnosis present

## 2015-03-25 DIAGNOSIS — Z87891 Personal history of nicotine dependence: Secondary | ICD-10-CM | POA: Diagnosis not present

## 2015-03-25 DIAGNOSIS — Z98891 History of uterine scar from previous surgery: Secondary | ICD-10-CM

## 2015-03-25 DIAGNOSIS — O2340 Unspecified infection of urinary tract in pregnancy, unspecified trimester: Secondary | ICD-10-CM | POA: Diagnosis present

## 2015-03-25 DIAGNOSIS — R109 Unspecified abdominal pain: Secondary | ICD-10-CM | POA: Diagnosis present

## 2015-03-25 DIAGNOSIS — O2342 Unspecified infection of urinary tract in pregnancy, second trimester: Secondary | ICD-10-CM

## 2015-03-25 LAB — RPR: RPR: NONREACTIVE

## 2015-03-25 SURGERY — Surgical Case
Anesthesia: Spinal | Site: Abdomen

## 2015-03-25 MED ORDER — WITCH HAZEL-GLYCERIN EX PADS: 1.0000 "application " | MEDICATED_PAD | CUTANEOUS | Status: DC | PRN

## 2015-03-25 MED ORDER — SIMETHICONE 80 MG PO CHEW
80.0000 mg | CHEWABLE_TABLET | Freq: Three times a day (TID) | ORAL | Status: DC
  Administered 2015-03-26 – 2015-03-28 (×5): 80 mg via ORAL
  Filled 2015-03-25 (×6): qty 1

## 2015-03-25 MED ORDER — KETOROLAC TROMETHAMINE 30 MG/ML IJ SOLN
INTRAMUSCULAR | Status: AC
  Filled 2015-03-25: qty 1

## 2015-03-25 MED ORDER — LACTATED RINGERS IV SOLN
INTRAVENOUS | Status: DC | PRN
  Administered 2015-03-25: 12:00:00 via INTRAVENOUS

## 2015-03-25 MED ORDER — SIMETHICONE 80 MG PO CHEW
80.0000 mg | CHEWABLE_TABLET | ORAL | Status: DC | PRN
  Administered 2015-03-26: 80 mg via ORAL

## 2015-03-25 MED ORDER — FENTANYL CITRATE (PF) 100 MCG/2ML IJ SOLN
25.0000 ug | INTRAMUSCULAR | Status: DC | PRN
  Administered 2015-03-25: 50 ug via INTRAVENOUS

## 2015-03-25 MED ORDER — NALOXONE HCL 0.4 MG/ML IJ SOLN: 0.4000 mg | INTRAMUSCULAR | Status: DC | PRN

## 2015-03-25 MED ORDER — LACTATED RINGERS IV SOLN
INTRAVENOUS | Status: DC
  Administered 2015-03-25: 11:00:00 via INTRAVENOUS

## 2015-03-25 MED ORDER — SCOPOLAMINE 1 MG/3DAYS TD PT72
1.0000 | MEDICATED_PATCH | Freq: Once | TRANSDERMAL | Status: DC
  Filled 2015-03-25: qty 1

## 2015-03-25 MED ORDER — SCOPOLAMINE 1 MG/3DAYS TD PT72
1.0000 | MEDICATED_PATCH | Freq: Once | TRANSDERMAL | Status: DC
  Administered 2015-03-25: 1.5 mg via TRANSDERMAL

## 2015-03-25 MED ORDER — OXYCODONE-ACETAMINOPHEN 5-325 MG PO TABS
2.0000 | ORAL_TABLET | ORAL | Status: DC | PRN
  Administered 2015-03-26 – 2015-03-28 (×10): 2 via ORAL
  Filled 2015-03-25 (×10): qty 2

## 2015-03-25 MED ORDER — OXYTOCIN 10 UNIT/ML IJ SOLN
INTRAMUSCULAR | Status: AC
  Filled 2015-03-25: qty 4

## 2015-03-25 MED ORDER — SODIUM BICARBONATE 8.4 % IV SOLN
INTRAVENOUS | Status: AC
  Filled 2015-03-25: qty 50

## 2015-03-25 MED ORDER — FENTANYL CITRATE (PF) 100 MCG/2ML IJ SOLN
INTRAMUSCULAR | Status: AC
  Filled 2015-03-25: qty 4

## 2015-03-25 MED ORDER — LANOLIN HYDROUS EX OINT: 1.0000 "application " | TOPICAL_OINTMENT | CUTANEOUS | Status: DC | PRN

## 2015-03-25 MED ORDER — NALBUPHINE HCL 10 MG/ML IJ SOLN: 5.0000 mg | INTRAMUSCULAR | Status: DC | PRN

## 2015-03-25 MED ORDER — FENTANYL CITRATE (PF) 100 MCG/2ML IJ SOLN
INTRAMUSCULAR | Status: AC
  Filled 2015-03-25: qty 2

## 2015-03-25 MED ORDER — NALBUPHINE HCL 10 MG/ML IJ SOLN: 5.0000 mg | Freq: Once | INTRAMUSCULAR | Status: DC | PRN

## 2015-03-25 MED ORDER — IBUPROFEN 600 MG PO TABS
600.0000 mg | ORAL_TABLET | Freq: Four times a day (QID) | ORAL | Status: DC
  Administered 2015-03-25 – 2015-03-28 (×12): 600 mg via ORAL
  Filled 2015-03-25 (×12): qty 1

## 2015-03-25 MED ORDER — MEPERIDINE HCL 25 MG/ML IJ SOLN: 6.2500 mg | INTRAMUSCULAR | Status: DC | PRN

## 2015-03-25 MED ORDER — OXYTOCIN 40 UNITS IN LACTATED RINGERS INFUSION - SIMPLE MED: 62.5000 mL/h | INTRAVENOUS | Status: AC

## 2015-03-25 MED ORDER — TETANUS-DIPHTH-ACELL PERTUSSIS 5-2.5-18.5 LF-MCG/0.5 IM SUSP: 0.5000 mL | Freq: Once | INTRAMUSCULAR | Status: DC

## 2015-03-25 MED ORDER — MORPHINE SULFATE (PF) 0.5 MG/ML IJ SOLN
INTRAMUSCULAR | Status: DC | PRN
  Administered 2015-03-25: 4 mg via EPIDURAL
  Administered 2015-03-25: 1 mg via INTRAVENOUS

## 2015-03-25 MED ORDER — LACTATED RINGERS IV SOLN
Freq: Once | INTRAVENOUS | Status: AC
  Administered 2015-03-25: 10:00:00 via INTRAVENOUS

## 2015-03-25 MED ORDER — METOCLOPRAMIDE HCL 5 MG/ML IJ SOLN
INTRAMUSCULAR | Status: DC | PRN
  Administered 2015-03-25: 10 mg via INTRAVENOUS

## 2015-03-25 MED ORDER — LACTATED RINGERS IV SOLN: 125.0000 mL/h | INTRAVENOUS | Status: DC

## 2015-03-25 MED ORDER — OXYCODONE-ACETAMINOPHEN 5-325 MG PO TABS
1.0000 | ORAL_TABLET | ORAL | Status: DC | PRN
Start: 2015-03-25 — End: 2015-03-28
  Administered 2015-03-26: 1 via ORAL
  Filled 2015-03-25 (×2): qty 1

## 2015-03-25 MED ORDER — DIBUCAINE 1 % RE OINT: 1.0000 "application " | TOPICAL_OINTMENT | RECTAL | Status: DC | PRN

## 2015-03-25 MED ORDER — DIPHENHYDRAMINE HCL 25 MG PO CAPS
25.0000 mg | ORAL_CAPSULE | Freq: Four times a day (QID) | ORAL | Status: DC | PRN
  Administered 2015-03-27 – 2015-03-28 (×5): 25 mg via ORAL
  Filled 2015-03-25 (×3): qty 1

## 2015-03-25 MED ORDER — EPHEDRINE SULFATE 50 MG/ML IJ SOLN
INTRAMUSCULAR | Status: DC | PRN
  Administered 2015-03-25: 5 mg via INTRAVENOUS

## 2015-03-25 MED ORDER — DIPHENHYDRAMINE HCL 25 MG PO CAPS
25.0000 mg | ORAL_CAPSULE | ORAL | Status: DC | PRN
  Filled 2015-03-25 (×2): qty 1

## 2015-03-25 MED ORDER — DIPHENHYDRAMINE HCL 50 MG/ML IJ SOLN
INTRAMUSCULAR | Status: DC | PRN
  Administered 2015-03-25: 25 mg via INTRAVENOUS

## 2015-03-25 MED ORDER — OXYTOCIN 10 UNIT/ML IJ SOLN
40.0000 [IU] | INTRAVENOUS | Status: DC | PRN
  Administered 2015-03-25: 40 [IU] via INTRAVENOUS

## 2015-03-25 MED ORDER — PRENATAL MULTIVITAMIN CH
1.0000 | ORAL_TABLET | Freq: Every day | ORAL | Status: DC
Start: 2015-03-25 — End: 2015-03-28
  Administered 2015-03-26 – 2015-03-28 (×3): 1 via ORAL
  Filled 2015-03-25 (×3): qty 1

## 2015-03-25 MED ORDER — MORPHINE SULFATE 0.5 MG/ML IJ SOLN
INTRAMUSCULAR | Status: AC
  Filled 2015-03-25: qty 100

## 2015-03-25 MED ORDER — SCOPOLAMINE 1 MG/3DAYS TD PT72
MEDICATED_PATCH | TRANSDERMAL | Status: AC
  Administered 2015-03-25: 1.5 mg via TRANSDERMAL
  Filled 2015-03-25: qty 1

## 2015-03-25 MED ORDER — LIDOCAINE-EPINEPHRINE (PF) 2 %-1:200000 IJ SOLN
INTRAMUSCULAR | Status: AC
  Filled 2015-03-25: qty 20

## 2015-03-25 MED ORDER — ZOLPIDEM TARTRATE 5 MG PO TABS: 5.0000 mg | ORAL_TABLET | Freq: Every evening | ORAL | Status: DC | PRN

## 2015-03-25 MED ORDER — NALOXONE HCL 1 MG/ML IJ SOLN
1.0000 ug/kg/h | INTRAMUSCULAR | Status: DC | PRN
  Filled 2015-03-25: qty 2

## 2015-03-25 MED ORDER — SODIUM BICARBONATE 8.4 % IV SOLN
INTRAVENOUS | Status: DC | PRN
  Administered 2015-03-25 (×5): 5 mL via EPIDURAL

## 2015-03-25 MED ORDER — SCOPOLAMINE 1 MG/3DAYS TD PT72
MEDICATED_PATCH | TRANSDERMAL | Status: AC
  Filled 2015-03-25: qty 1

## 2015-03-25 MED ORDER — PHENYLEPHRINE 8 MG IN D5W 100 ML (0.08MG/ML) PREMIX OPTIME
INJECTION | INTRAVENOUS | Status: DC | PRN
  Administered 2015-03-25: 40 ug/min via INTRAVENOUS

## 2015-03-25 MED ORDER — MEPERIDINE HCL 25 MG/ML IJ SOLN
INTRAMUSCULAR | Status: AC
  Filled 2015-03-25: qty 1

## 2015-03-25 MED ORDER — KETOROLAC TROMETHAMINE 30 MG/ML IJ SOLN: 30.0000 mg | Freq: Four times a day (QID) | INTRAMUSCULAR | Status: AC | PRN

## 2015-03-25 MED ORDER — ONDANSETRON HCL 4 MG/2ML IJ SOLN
INTRAMUSCULAR | Status: DC | PRN
  Administered 2015-03-25: 4 mg via INTRAVENOUS

## 2015-03-25 MED ORDER — ACETAMINOPHEN 325 MG PO TABS: 650.0000 mg | ORAL_TABLET | ORAL | Status: DC | PRN

## 2015-03-25 MED ORDER — SIMETHICONE 80 MG PO CHEW
80.0000 mg | CHEWABLE_TABLET | ORAL | Status: DC
  Administered 2015-03-25 – 2015-03-28 (×3): 80 mg via ORAL
  Filled 2015-03-25 (×3): qty 1

## 2015-03-25 MED ORDER — ACETAMINOPHEN 500 MG PO TABS
1000.0000 mg | ORAL_TABLET | Freq: Four times a day (QID) | ORAL | Status: AC
  Administered 2015-03-25 (×2): 1000 mg via ORAL
  Filled 2015-03-25 (×2): qty 2

## 2015-03-25 MED ORDER — ALBUTEROL SULFATE (2.5 MG/3ML) 0.083% IN NEBU: 3.0000 mL | INHALATION_SOLUTION | Freq: Four times a day (QID) | RESPIRATORY_TRACT | Status: DC | PRN

## 2015-03-25 MED ORDER — ONDANSETRON HCL 4 MG/2ML IJ SOLN
4.0000 mg | Freq: Three times a day (TID) | INTRAMUSCULAR | Status: DC | PRN
  Administered 2015-03-25: 4 mg via INTRAVENOUS
  Filled 2015-03-25: qty 2

## 2015-03-25 MED ORDER — PROMETHAZINE HCL 25 MG/ML IJ SOLN: 6.2500 mg | INTRAMUSCULAR | Status: DC | PRN

## 2015-03-25 MED ORDER — LACTATED RINGERS IV SOLN
INTRAVENOUS | Status: DC
  Administered 2015-03-25 – 2015-03-26 (×2): via INTRAVENOUS

## 2015-03-25 MED ORDER — MEPERIDINE HCL 25 MG/ML IJ SOLN
INTRAMUSCULAR | Status: DC | PRN
  Administered 2015-03-25: 12.5 mg via INTRAVENOUS

## 2015-03-25 MED ORDER — DIPHENHYDRAMINE HCL 50 MG/ML IJ SOLN: 12.5000 mg | INTRAMUSCULAR | Status: DC | PRN

## 2015-03-25 MED ORDER — SENNOSIDES-DOCUSATE SODIUM 8.6-50 MG PO TABS
2.0000 | ORAL_TABLET | ORAL | Status: DC
  Administered 2015-03-25 – 2015-03-28 (×3): 2 via ORAL
  Filled 2015-03-25 (×3): qty 2

## 2015-03-25 MED ORDER — SODIUM CHLORIDE 0.9 % IJ SOLN: 3.0000 mL | INTRAMUSCULAR | Status: DC | PRN

## 2015-03-25 MED ORDER — MENTHOL 3 MG MT LOZG: 1.0000 | LOZENGE | OROMUCOSAL | Status: DC | PRN

## 2015-03-25 SURGICAL SUPPLY — 35 items
APL SKNCLS STERI-STRIP NONHPOA (GAUZE/BANDAGES/DRESSINGS) ×1
BENZOIN TINCTURE PRP APPL 2/3 (GAUZE/BANDAGES/DRESSINGS) ×3 IMPLANT
CATH ROBINSON RED A/P 16FR (CATHETERS) IMPLANT
CLAMP CORD UMBIL (MISCELLANEOUS) IMPLANT
CLOSURE WOUND 1/2 X4 (GAUZE/BANDAGES/DRESSINGS) ×1
CLOTH BEACON ORANGE TIMEOUT ST (SAFETY) ×3 IMPLANT
COUNTER NEEDLE 1200 MAGNETIC (NEEDLE) ×2 IMPLANT
DRAPE SHEET LG 3/4 BI-LAMINATE (DRAPES) IMPLANT
DRSG OPSITE POSTOP 4X10 (GAUZE/BANDAGES/DRESSINGS) ×3 IMPLANT
DURAPREP 26ML APPLICATOR (WOUND CARE) ×3 IMPLANT
ELECT REM PT RETURN 9FT ADLT (ELECTROSURGICAL) ×3
ELECTRODE REM PT RTRN 9FT ADLT (ELECTROSURGICAL) ×1 IMPLANT
EXTRACTOR VACUUM M CUP 4 TUBE (SUCTIONS) IMPLANT
EXTRACTOR VACUUM M CUP 4' TUBE (SUCTIONS)
GLOVE BIOGEL PI IND STRL 7.5 (GLOVE) ×2 IMPLANT
GLOVE BIOGEL PI INDICATOR 7.5 (GLOVE) ×4
GLOVE ECLIPSE 7.5 STRL STRAW (GLOVE) ×3 IMPLANT
GOWN STRL REUS W/TWL LRG LVL3 (GOWN DISPOSABLE) ×9 IMPLANT
KIT ABG SYR 3ML LUER SLIP (SYRINGE) IMPLANT
NDL HYPO 25X5/8 SAFETYGLIDE (NEEDLE) IMPLANT
NEEDLE HYPO 25X5/8 SAFETYGLIDE (NEEDLE) IMPLANT
NS IRRIG 1000ML POUR BTL (IV SOLUTION) ×3 IMPLANT
PACK C SECTION WH (CUSTOM PROCEDURE TRAY) ×3 IMPLANT
PAD OB MATERNITY 4.3X12.25 (PERSONAL CARE ITEMS) ×3 IMPLANT
RTRCTR C-SECT PINK 25CM LRG (MISCELLANEOUS) IMPLANT
STRIP CLOSURE SKIN 1/2X4 (GAUZE/BANDAGES/DRESSINGS) ×2 IMPLANT
SUT MNCRL 0 VIOLET CTX 36 (SUTURE) IMPLANT
SUT MONOCRYL 0 CTX 36 (SUTURE)
SUT VIC AB 0 CTX 36 (SUTURE) ×9
SUT VIC AB 0 CTX36XBRD ANBCTRL (SUTURE) ×3 IMPLANT
SUT VIC AB 2-0 CT1 27 (SUTURE) ×3
SUT VIC AB 2-0 CT1 TAPERPNT 27 (SUTURE) ×1 IMPLANT
SUT VIC AB 4-0 KS 27 (SUTURE) ×3 IMPLANT
TOWEL OR 17X24 6PK STRL BLUE (TOWEL DISPOSABLE) ×3 IMPLANT
TRAY FOLEY CATH SILVER 14FR (SET/KITS/TRAYS/PACK) ×3 IMPLANT

## 2015-03-25 NOTE — Anesthesia Preprocedure Evaluation (Addendum)
Anesthesia Evaluation  Patient identified by MRN, date of birth, ID band Patient awake    Reviewed: Allergy & Precautions, H&P , NPO status , Patient's Chart, lab work & pertinent test results  Airway Mallampati: II  TM Distance: >3 FB Neck ROM: full    Dental no notable dental hx.    Pulmonary neg pulmonary ROS, former smoker,  breath sounds clear to auscultation  Pulmonary exam normal       Cardiovascular hypertension, negative cardio ROS Normal cardiovascular examRhythm:regular Rate:Normal     Neuro/Psych negative neurological ROS  negative psych ROS   GI/Hepatic negative GI ROS, (+)     substance abuse  marijuana use,   Endo/Other  negative endocrine ROS  Renal/GU negative Renal ROS  negative genitourinary   Musculoskeletal   Abdominal   Peds  Hematology negative hematology ROS (+)   Anesthesia Other Findings Pregnancy - 5th C/S, G5P4 Platelets and allergies reviewed Denies active cardiac or pulmonary symptoms, METS > 4  Denies blood thinning medications, bleeding disorders, hypertension, asthma, supine hypotension syndrome, previous anesthesia difficulties    Reproductive/Obstetrics (+) Pregnancy                            Anesthesia Physical Anesthesia Plan  ASA: III  Anesthesia Plan: Combined Spinal and Epidural   Post-op Pain Management:    Induction:   Airway Management Planned:   Additional Equipment:   Intra-op Plan:   Post-operative Plan:   Informed Consent: I have reviewed the patients History and Physical, chart, labs and discussed the procedure including the risks, benefits and alternatives for the proposed anesthesia with the patient or authorized representative who has indicated his/her understanding and acceptance.     Plan Discussed with: Anesthesiologist, CRNA and Surgeon  Anesthesia Plan Comments:        Anesthesia Quick Evaluation

## 2015-03-25 NOTE — Anesthesia Procedure Notes (Signed)
Epidural Patient location during procedure: OR  Staffing Anesthesiologist: Allona Gondek Performed by: anesthesiologist   Preanesthetic Checklist Completed: patient identified, site marked, surgical consent, pre-op evaluation, timeout performed, IV checked, risks and benefits discussed and monitors and equipment checked  Epidural Patient position: sitting Prep: site prepped and draped and DuraPrep Patient monitoring: continuous pulse ox and blood pressure Approach: midline Location: L3-L4 Injection technique: LOR saline  Needle:  Needle type: Tuohy  Needle gauge: 17 G Needle length: 9 cm and 9 Needle insertion depth: 6 cm Catheter type: closed end flexible Catheter size: 19 Gauge Catheter at skin depth: 10 cm Test dose: negative  Assessment Sensory level: T4 Events: blood not aspirated, injection not painful, no injection resistance, negative IV test and no paresthesia  Additional Notes No complications, did take several attempts at CSE in 2 different locations with 2 different providers and unable to aspirate CSF so plan changed to place epidural catheter and dose appropriate, thread easily with no parathesia, no CSF ever aspirated

## 2015-03-25 NOTE — Transfer of Care (Signed)
Immediate Anesthesia Transfer of Care Note  Patient: Wendy Floyd  Procedure(s) Performed: Procedure(s) with comments: CESAREAN SECTION (N/A) - Requested 03/25/15 @ 11:00a  Patient Location: PACU  Anesthesia Type:Epidural  Level of Consciousness: awake, alert  and oriented  Airway & Oxygen Therapy: Patient Spontanous Breathing  Post-op Assessment: Report given to RN  Post vital signs: Reviewed  Last Vitals:  Filed Vitals:   03/25/15 0921  BP: 133/84  Pulse: 82  Temp: 36.3 C  Resp: 20    Complications: No apparent anesthesia complications

## 2015-03-25 NOTE — Op Note (Signed)
[name] PROCEDURE DATE: 03/25/2015  PREOPERATIVE DIAGNOSIS: Intrauterine pregnancy at  [redacted]w[redacted]d weeks gestation; previous uterine incision kerr x3 or greater  POSTOPERATIVE DIAGNOSIS: The same  PROCEDURE: Repeat Low Transverse Cesarean Section  SURGEON:  Dr. Loma Boston  ASSISTANT: Dr Mora Bellman - the use of an assistant was essential to provide good retraction due to the complexity of the case.  INDICATIONS: Wendy Floyd is a 34 y.o. G9Q1194 at [redacted]w[redacted]d scheduled for cesarean section secondary to previous uterine incision kerr x3 or greater.  The risks of cesarean section discussed with the patient included but were not limited to: bleeding which may require transfusion or reoperation; infection which may require antibiotics; injury to bowel, bladder, ureters or other surrounding organs; injury to the fetus; need for additional procedures including hysterectomy in the event of a life-threatening hemorrhage; placental abnormalities wth subsequent pregnancies, incisional problems, thromboembolic phenomenon and other postoperative/anesthesia complications. The patient concurred with the proposed plan, giving informed written consent for the procedure.    FINDINGS:  Viable female infant in cephalic presentation.  Apgars 8 and 9, weight pending.  Clear amniotic fluid.  Intact placenta, three vessel cord.  Normal uterus, fallopian tubes and ovaries bilaterally.  ANESTHESIA:    Spinal INTRAVENOUS FLUIDS:1500 ml ESTIMATED BLOOD LOSS: 700 ml URINE OUTPUT:  150 ml SPECIMENS: Placenta sent to L&D COMPLICATIONS: None immediate  PROCEDURE IN DETAIL:  The patient received intravenous antibiotics and had sequential compression devices applied to her lower extremities while in the preoperative area.  She was then taken to the operating room where epidural anesthesia was dosed up to surgical level. She was then placed in a dorsal supine position with a leftward tilt, and prepped and draped in a  sterile manner.  A foley catheter was placed into her bladder and attached to constant gravity, which drained clear fluid throughout.  After an adequate timeout was performed, a Pfannenstiel skin incision was made with scalpel and carried through to the underlying layer of fascia. The fascia was incised in the midline and this incision was extended bilaterally using the Mayo scissors. The rectus muscles were densely adhered to the fascia.  Kocher clamps were applied to the superior aspect of the fascial incision and the underlying rectus muscles were dissected off sharply with mayo scissors. A similar process was carried out on the inferior aspect of the facial incision. The rectus muscles were separated in the midline bluntly and the peritoneum was entered bluntly. An Alexis retractor was placed to aid in visualization of the uterus.  Attention was turned to the lower uterine segment where the vesicoperitoneum was grasped with Russian forceps and a bladder flap was made with Metzenbaum scissors.  A transverse hysterotomy was then made with a scalpel and extended bilaterally bluntly. The infant was successfully delivered, and cord was clamped and cut and infant was handed over to awaiting neonatology team. Uterine massage was then administered and the placenta delivered intact with three-vessel cord. The uterus was then cleared of clot and debris.  The hysterotomy was closed with 0 Vicryl in a running locked fashion, and an imbricating layer was also placed with a 0 Vicryl. Overall, excellent hemostasis was noted. The abdomen and the pelvis were cleared of all clot and debris and the Ubaldo Glassing was removed. Hemostasis was confirmed on all surfaces.  The peritoneum was reapproximated using 2-0 vicryl running stitches. The fascia was then closed using 0 Vicryl in a running fashion.  The skin was closed with 4-0 vicryl. The patient  tolerated the procedure well. Sponge, lap, instrument and needle counts were correct x 2.  She was taken to the recovery room in stable condition.    Truett Mainland, DO 03/25/2015 12:29 PM

## 2015-03-25 NOTE — Anesthesia Postprocedure Evaluation (Signed)
  Anesthesia Post-op Note  Patient: Wendy Floyd  Procedure(s) Performed: Procedure(s) (LRB): CESAREAN SECTION (N/A)  Patient Location: PACU  Anesthesia Type: Epidural  Level of Consciousness: awake and alert   Airway and Oxygen Therapy: Patient Spontanous Breathing  Post-op Pain: mild  Post-op Assessment: Post-op Vital signs reviewed, Patient's Cardiovascular Status Stable, Respiratory Function Stable, Patent Airway and No signs of Nausea or vomiting  Last Vitals:  Filed Vitals:   03/25/15 1238  BP: 131/61  Pulse: 70  Temp: 36.4 C  Resp: 16    Post-op Vital Signs: stable   Complications: No apparent anesthesia complications

## 2015-03-25 NOTE — H&P (Signed)
Faculty Practice H&P  Wendy Floyd is a 34 y.o. female [phone] with IUP at [redacted]w[redacted]d presenting for cesarean section for elective repeat. Pregnancy was been complicated by hypertension, previous cesarean section x 4.    Pt states she has been having no contractions, no vaginal bleeding, intact membranes, with normal fetal movement.     Prenatal Course Source of Care: Health Department Pregnancy complications or risks: Patient Active Problem List   Diagnosis Date Noted  . Group B Streptococcus urinary tract infection complicating pregnancy in second trimester 11/28/2014  . Abdominal pain affecting pregnancy   . Mild dehydration   . Traumatic injury during pregnancy in second trimester   . [redacted] weeks gestation of pregnancy   . Pregnancy 09/24/2014  . Smoker 09/24/2014   She desires to Depo-Provera.  She plans to plans to breastfeed  Prenatal labs and studies: ABO, Rh: --/--/O POS (08/24 1235) Antibody: NEG (08/24 1235) Rubella:   RPR: Non Reactive (08/24 1235)  HBsAg: Negative (06/07 0000)  HIV: Non-reactive (06/07 0000)  GBS:    1 hr Glucola 90 Genetic screeningnormal Anatomy US normal  Past Medical History:  Past Medical History  Diagnosis Date  . Endometriosis   . Fibroids   . Anemia   . Hypertension     Past Surgical History:  Past Surgical History  Procedure Laterality Date  . Cesarean section      4 c/s prior     Obstetrical History:  OB History    Gravida Para Term Preterm AB TAB SAB Ectopic Multiple Living   5 4 4  0 0 0 0 0 0 4       Social History:  Social History   Social History  . Marital Status: Married    Spouse Name: N/A  . Number of Children: N/A  . Years of Education: N/A   Social History Main Topics  . Smoking status: Former Smoker -- 0.50 packs/day    Quit date: 07/30/2014  . Smokeless tobacco: Never Used  . Alcohol Use: Yes  . Drug Use: Yes    Special: Marijuana     Comment: last year   . Sexual Activity: Yes    Birth  Control/ Protection: None   Other Topics Concern  . Not on file   Social History Narrative    Family History:  Family History  Problem Relation Age of Onset  . Diabetes Mother   . Hypertension Mother   . Diabetes Father   . Hearing loss Paternal Grandmother     Medications:  Prenatal vitamins,  Current Facility-Administered Medications  Medication Dose Route Frequency Provider Last Rate Last Dose  . cefoTEtan (CEFOTAN) 2 g in dextrose 5 % 50 mL IVPB  2 g Intravenous On Call to [place], DO      . lactated ringers infusion   Intravenous Once Truett Mainland, DO      . lactated ringers infusion   Intravenous Continuous Tanna Savoy Myda Detwiler, DO      . lactated ringers infusion  125 mL/hr Intravenous Continuous Tanna Savoy Kameron Glazebrook, DO      . scopolamine (TRANSDERM-SCOP) 1 MG/3DAYS 1.5 mg  1 patch Transdermal Once Truett Mainland, DO        Allergies: No Known Allergies  Review of Systems: - negative  Physical Exam: Blood pressure 133/84, pulse 82, temperature 97.3 F (36.3 C), resp. rate 20, last menstrual period 06/25/2014, SpO2 100 %. GENERAL: Well-developed, well-nourished female in no acute distress.  LUNGS: Clear  to auscultation bilaterally.  HEART: Regular rate and rhythm. ABDOMEN: Soft, nontender, nondistended, gravid. EFW 7 lbs EXTREMITIES: Nontender, no edema, 2+ distal pulses.    Pertinent Labs/Studies:    Assessment : Wendy Floyd is a 34 y.o. [id number] at [redacted]w[redacted]d being admitted for cesarean section secondary to elective repeat  Plan: The risks of cesarean section discussed with the patient included but were not limited to: bleeding which may require transfusion or reoperation; infection which may require antibiotics; injury to bowel, bladder, ureters or other surrounding organs; injury to the fetus; need for additional procedures including hysterectomy in the event of a life-threatening hemorrhage; placental abnormalities wth subsequent pregnancies, incisional  problems, thromboembolic phenomenon and other postoperative/anesthesia complications. The patient concurred with the proposed plan, giving informed written consent for the procedure.   Patient has been NPO since midnight and will remain NPO for procedure.  Preoperative prophylactic Ancef ordered on call to the OR.    Truett Mainland, DO 03/25/2015, 9:28 AM

## 2015-03-26 ENCOUNTER — Encounter (HOSPITAL_COMMUNITY): Payer: Self-pay | Admitting: Family Medicine

## 2015-03-26 LAB — CBC
HCT: 22.6 % — ABNORMAL LOW (ref 36.0–46.0)
Hemoglobin: 7.7 g/dL — ABNORMAL LOW (ref 12.0–15.0)
MCH: 28.2 pg (ref 26.0–34.0)
MCHC: 34.1 g/dL (ref 30.0–36.0)
MCV: 82.8 fL (ref 78.0–100.0)
PLATELETS: 211 10*3/uL (ref 150–400)
RBC: 2.73 MIL/uL — AB (ref 3.87–5.11)
RDW: 14.5 % (ref 11.5–15.5)
WBC: 9 10*3/uL (ref 4.0–10.5)

## 2015-03-26 LAB — BIRTH TISSUE RECOVERY COLLECTION (PLACENTA DONATION)

## 2015-03-26 MED ORDER — LACTATED RINGERS IV BOLUS (SEPSIS)
1000.0000 mL | Freq: Once | INTRAVENOUS | Status: AC
Start: 2015-03-26 — End: 2015-03-26
  Administered 2015-03-26: 1000 mL via INTRAVENOUS

## 2015-03-26 MED ORDER — LACTATED RINGERS IV BOLUS (SEPSIS)
500.0000 mL | Freq: Once | INTRAVENOUS | Status: AC
  Administered 2015-03-26: 500 mL via INTRAVENOUS

## 2015-03-26 NOTE — Progress Notes (Signed)
Pt refused to stand at 20:40 check, states that she was dizzy and nauseated. Zofran given and nausea relieved. At 00:00 check, pt had UOP of 75mL. Pt refused to even dangle with this check. Stedy offered, refused. Called CNM on call and orders to bolus 109mL LR given. Informed CNM that pt has refused to stand or get out of bed. Will continue to monitor and notify CNM as needed. Wille Celeste

## 2015-03-26 NOTE — Anesthesia Postprocedure Evaluation (Signed)
Anesthesia Post Note  Patient: Wendy Floyd  Procedure(s) Performed: Procedure(s) (LRB): CESAREAN SECTION (N/A)  Anesthesia type: Epidural  Patient location: Mother/Baby  Post pain: Pain level controlled  Post assessment: Post-op Vital signs reviewed  Last Vitals:  Filed Vitals:   03/26/15 0350  BP: 111/62  Pulse: 74  Temp: 36.4 C  Resp: 16    Post vital signs: Reviewed  Level of consciousness: awake  Complications: No apparent anesthesia complications

## 2015-03-26 NOTE — Progress Notes (Deleted)
Pt refused to stand at 20:40 check, states that she was dizzy and nauseated. Zofran given and nausea relieved. At 00:00 check, pt had UOP of 36mL. Pt refused to even dangle with this check. Stedy offered, refused. Dr. Corinna Capra called and order for 500 mL LR bolus ordered and given. Will continue to monitor and notify MD as needed. Wendy Floyd
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
## 2015-03-26 NOTE — Progress Notes (Signed)
UR chart review completed.  

## 2015-03-26 NOTE — Addendum Note (Signed)
Addendum  created 03/26/15 0831 by Talbot Grumbling, CRNA   Modules edited: Notes Section   Notes Section:  File: [id number]

## 2015-03-26 NOTE — Progress Notes (Signed)
Spoke with [name], CNM and informed that pt is having minimal UOP, 129mL in 4 hours, advised to run another bag of LR at 189mL/hr this time. Informed CNM that pt non-compliant, and still refusing to get out of bed. Wendy Floyd

## 2015-03-26 NOTE — Progress Notes (Signed)
Post Partum Day 1/POD#1 Subjective:  Wendy Floyd is a 34 y.o. R1M2111 [redacted]w[redacted]d s/p rLTCS.  No acute events overnight.  Pt denies problems with ambulating, voiding or po intake.  She denies nausea or vomiting.  Pain is well controlled.  She has had flatus. She has not had bowel movement.  Lochia Minimal.  Plan for birth control is no method.  Method of Feeding: Breast  Objective: Blood pressure 111/62, pulse 74, temperature 97.6 F (36.4 C), temperature source Axillary, resp. rate 16, last menstrual period 06/25/2014, SpO2 100 %, unknown if currently breastfeeding.  Physical Exam:  General: alert, cooperative and no distress Lochia:normal flow Chest: CTAB Heart: reg rate Abdomen: +BS, soft, appropriately tender Uterine Fundus: firm Incision: bandage in place. Recently changed with minimal blood.  DVT Evaluation: No evidence of DVT seen on physical exam. Extremities: no edema   Recent Labs  03/24/15 1235 03/26/15 0540  HGB 13.5 7.7*  HCT 39.1 22.6*    Assessment/Plan:  ASSESSMENT: Wendy Floyd is a 34 y.o. N3V6701 [redacted]w[redacted]d s/p rLTCS Routine PP care/Post CS care Breastfeeding Plan for discharge tomorrow vs POD#3   LOS: 1 day   Caren Macadam 03/26/2015, 9:22 AM

## 2015-03-27 NOTE — Progress Notes (Signed)
CC: Right lower extremity posterior thigh pain  HPI:  S/P neuraxial anesthesia (epidural for C section 48 hours prior) patient complains of new right posterior leg pain. Sensation is intact.  There is no new motor deficit.  Patient does not complain of fever. Her back has no tenderness/erythema/bruising.  ROS:  A twelve system review was performed with the following pertinent negative and positive findings:  Physical Exam:  VITAL: Afebrile Vital Signs stable SITE: clean and non-erythematous LUNGS: clear to ascultation bilaterally HEART: regular rate and rhythm  Assessment and Plan:  The patient's unilateral posterior thigh, intermittent, pain symptoms are new today and appear to be musculoskeletal in nature given their intermittent nature, lack of any changes to sensory (to pinprick) and motor. It is reassuring that yesterday she had no issues, is afebrile, has no back tenderness. Overall clinically she appears very well. Physical Therapy may be an option in the future if symptoms persist. As always, please correlate with primary obstetric physician evaluation of symptoms as well.   I have spoken with the patient and offered to see them for follow up in the MAU at [facility]. If the patient's symptoms worsen or include and of the following:  new pain radiating down both legs,  bowel  or bladder dysfunction, numbness, motor weakness, severe pain at the epidural insertion site, or fever,  we would recommend urgent consult with a neurologist, neurosurgeon, or orthopedic surgeon.  Thank you for the call.  If any further questions arise please call [phone].

## 2015-03-27 NOTE — Progress Notes (Signed)
Post Partum Day 2/POD 2  Subjective:  DELESA KAWA is a 34 y.o. [id number] [redacted]w[redacted]d s/p rLTCS.  No acute events overnight.  Pt denies problems with ambulating, voiding or po intake.  She denies nausea or vomiting.  Pain is moderately controlled.  She has had flatus. She has not had bowel movement.  Lochia Small.  Plan for birth control is Depo-Provera.  Method of Feeding: Breast.  Objective: BP 80/60 mmHg  Pulse 97  Temp(Src) 98.3 F (36.8 C) (Oral)  Resp 20  SpO2 100%  LMP 06/25/2014 (Approximate)  Breastfeeding? Unknown  Physical Exam:  General: alert, cooperative and no distress Lochia:normal flow Chest: CTAB Heart: RRR no murmurs appreciated Abdomen: soft, nontender, fundus firm at/below umbilicus Uterine Fundus: firm DVT Evaluation: No evidence of DVT seen on physical exam. Extremities: trace edema   Recent Labs  03/24/15 1235 03/26/15 0540  HGB 13.5 7.7*  HCT 39.1 22.6*    Assessment/Plan:  ASSESSMENT: BRIGITTA PRICER is a 34 y.o. F1M3846 [redacted]w[redacted]d ppd #1 s/p rLTCS doing well.   Discharge home   LOS: 2 days   Adin Hector 03/27/2015, 11:00 AM   OB fellow attestation Post Partum Day 2 I have seen and examined this patient and agree with above documentation in the resident's note.   OTTILIA PIPPENGER is a 34 y.o. [id number] s/p rLTCS.  Pt denies problems with ambulating, voiding or po intake. Pain is well controlled.  Plan for birth control is Depo-Provera.  Method of Feeding: breast  PE:  BP 80/60 mmHg  Pulse 97  Temp(Src) 98.3 F (36.8 C) (Oral)  Resp 20  SpO2 100%  LMP 06/25/2014 (Approximate)  Breastfeeding? Unknown Fundus firm Incision c/d/i  Plan for discharge: tomorrow, routine prenatal care  Caren Macadam, MD 2:15 PM

## 2015-03-27 NOTE — Lactation Note (Signed)
This note was copied from the chart of Wendy Training and development officer. Lactation Consultation Note  Experienced BF mom reports BF is going well. He last BF experience was 12 years ago.  Ameria latched while we were in the room and swallows were heard.  Intermittent snapback was present as baby created suction to remove the milk but she was able to transfer and swallow.  The deeper she held the breast the more pronounced it was.  Mom is able to hand express and also was given a harmony.  She is also giving formula occasionally by choice.  Aware of support groups and outpatient services.  Patient Name: Wendy Floyd Today's Date: 03/27/2015     Maternal Data Has patient been taught Hand Expression?: Yes Does the patient have breastfeeding experience prior to this delivery?: Yes  Feeding Feeding Type: Breast Fed Length of feed: 10 min  LATCH Score/Interventions Latch: Grasps breast easily, tongue down, lips flanged, rhythmical sucking.  Audible Swallowing: A few with stimulation  Type of Nipple: Everted at rest and after stimulation  Comfort (Breast/Nipple): Soft / non-tender     Hold (Positioning): Assistance needed to correctly position infant at breast and maintain latch.  LATCH Score: 8  Lactation Tools Discussed/Used WIC Program: Yes   Consult Status Consult Status: Follow-up Date: 03/28/15 Follow-up type: In-patient    Van Clines 03/27/2015, 4:09 PM

## 2015-03-28 MED ORDER — IBUPROFEN 600 MG PO TABS: 600.0000 mg | ORAL_TABLET | Freq: Four times a day (QID) | ORAL | Status: DC | PRN

## 2015-03-28 MED ORDER — OXYCODONE-ACETAMINOPHEN 5-325 MG PO TABS: 1.0000 | ORAL_TABLET | ORAL | Status: DC | PRN

## 2015-03-28 NOTE — Discharge Instructions (Signed)

## 2015-03-28 NOTE — Discharge Summary (Signed)
OB Discharge Summary  Patient Name: Wendy Floyd DOB: 1980-12-27 MRN: [id number]  Date of admission: 03/25/2015 Delivering MD: Truett Mainland   Date of discharge: 03/28/2015  Admitting diagnosis: Scheduled Cesarean section   Intrauterine pregnancy: [redacted]w[redacted]d     Secondary diagnosis: Chronic Hypertension     Discharge diagnosis: Term Pregnancy Delivered  Method of delivery:      Information for the patient's newborn:  Wendy, Floyd [[id number]]  Delivery Method: C-Section, Low Transverse (Filed from Delivery Summary)                                                                                                     Post partum procedures:none  Augmentation: n/a  Complications:None  Hospital course:  Sceduled C/S   34 y.o. yo G5P5005 at [redacted]w[redacted]d was admitted to the hospital 03/25/2015 for scheduled cesarean section with the following indication:Elective Repeat.  Patient delivered a Viable infant.03/25/2015 Wendy Floyd is a 34 y.o. female [phone] with IUP at [redacted]w[redacted]d presenting for cesarean section for elective repeat. Pregnancy was been complicated by hypertension, previous cesarean section x 4.   Pt states she has been having no contractions, no vaginal bleeding, intact membranes, with normal fetal movement.  Details of operation can be found in separate operative note.  Pateint had an uncomplicated postpartum course.  She is ambulating, tolerating a regular diet, passing flatus, and urinating well. Patient is discharged home in stable condition on No discharge date for patient encounter.          Physical exam  Filed Vitals:   03/26/15 1704 03/27/15 0554 03/27/15 1800 03/28/15 0531  BP: 120/58 80/60 105/74 115/61  Pulse: 92 97 95 73  Temp: 98.4 F (36.9 C) 98.3 F (36.8 C) 98.8 F (37.1 C) 98.4 F (36.9 C)  TempSrc: Axillary Oral Oral Oral  Resp: 18 20 20 18   SpO2:   98%    General: alert, cooperative and no distress Lochia: appropriate Uterine Fundus:  firm Incision: Dressing is clean, dry, and intact DVT Evaluation: No evidence of DVT seen on physical exam. Labs: Lab Results  Component Value Date   WBC 9.0 03/26/2015   HGB 7.7* 03/26/2015   HCT 22.6* 03/26/2015   MCV 82.8 03/26/2015   PLT 211 03/26/2015   CMP Latest Ref Rng 07/30/2014  Glucose 70 - 99 mg/dL 110(H)  BUN 6 - 23 mg/dL 7  Creatinine 0.50 - 1.10 mg/dL 0.63  Sodium 135 - 145 mmol/L 136  Potassium 3.5 - 5.1 mmol/L 3.7  Chloride 96 - 112 mEq/L 106  CO2 19 - 32 mmol/L 26  Calcium 8.4 - 10.5 mg/dL 8.6  Total Protein 6.0 - 8.3 g/dL -  Total Bilirubin 0.3 - 1.2 mg/dL -  Alkaline Phos 39 - 117 U/L -  AST 0 - 37 U/L -  ALT 0 - 35 U/L -    Discharge instruction: per After Visit Summary and "Baby and Me Booklet".  Medications:   Medication List    ASK your doctor about these medications        albuterol 108 (  90 BASE) MCG/ACT inhaler  Commonly known as:  PROVENTIL HFA;VENTOLIN HFA  Inhale 2 puffs into the lungs every 6 (six) hours as needed for wheezing or shortness of breath.     prenatal multivitamin Tabs tablet  Take 1 tablet by mouth daily at 12 noon.        Diet: routine diet  Activity: Advance as tolerated. Pelvic rest for 6 weeks.   Outpatient follow up:6 weeks  Postpartum contraception: Depo Provera  Newborn Data: Live born female  Birth Weight: 6 lb 8.4 oz (2960 g) APGAR: 8, 9  Baby Feeding: Breast Disposition:home with mother   03/28/2015 Emeterio Reeve, MD

## 2015-03-28 NOTE — Lactation Note (Signed)
This note was copied from the chart of Wendy Training and development officer. Lactation Consultation Note  Baby is BF well.  Encouraged better alignment.  Breasts are filling. Reviewed hand pump. Aware of support group and outpatient services.  Patient Name: Wendy Floyd Today's Date: 03/28/2015     Maternal Data    Feeding Feeding Type: Breast Fed Length of feed: 10 min  LATCH Score/Interventions                      Lactation Tools Discussed/Used     Consult Status      Van Clines 03/28/2015, 1:16 PM

## 2015-04-01 ENCOUNTER — Inpatient Hospital Stay (HOSPITAL_COMMUNITY)
Admission: AD | Admit: 2015-04-01 | Discharge: 2015-04-01 | Disposition: A | Discharge status: Home / Self Care | Payer: Medicaid Other | Source: Ambulatory Visit | Attending: Obstetrics & Gynecology | Admitting: Obstetrics & Gynecology

## 2015-04-01 ENCOUNTER — Encounter (HOSPITAL_COMMUNITY): Payer: Self-pay

## 2015-04-01 DIAGNOSIS — Z5189 Encounter for other specified aftercare: Secondary | ICD-10-CM

## 2015-04-01 DIAGNOSIS — O9089 Other complications of the puerperium, not elsewhere classified: Secondary | ICD-10-CM | POA: Insufficient documentation

## 2015-04-01 DIAGNOSIS — I158 Other secondary hypertension: Secondary | ICD-10-CM | POA: Insufficient documentation

## 2015-04-01 DIAGNOSIS — L929 Granulomatous disorder of the skin and subcutaneous tissue, unspecified: Secondary | ICD-10-CM | POA: Diagnosis not present

## 2015-04-01 DIAGNOSIS — Z87891 Personal history of nicotine dependence: Secondary | ICD-10-CM | POA: Insufficient documentation

## 2015-04-01 DIAGNOSIS — O10919 Unspecified pre-existing hypertension complicating pregnancy, unspecified trimester: Secondary | ICD-10-CM

## 2015-04-01 MED ORDER — IBUPROFEN 600 MG PO TABS
600.0000 mg | ORAL_TABLET | Freq: Once | ORAL | Status: AC
  Administered 2015-04-01: 600 mg via ORAL
  Filled 2015-04-01: qty 1

## 2015-04-01 NOTE — MAU Note (Signed)
Open area on C/S incision had one week ago pain 7/10

## 2015-04-01 NOTE — Discharge Instructions (Signed)
Incision Care An incision is when a surgeon cuts into your body tissues. After surgery, the incision needs to be cared for properly to prevent infection.  HOME CARE INSTRUCTIONS   Take all medicine as directed by your caregiver. Only take over-the-counter or prescription medicines for pain, discomfort, or fever as directed by your caregiver.  Do not remove your bandage (dressing) or get your incision wet until your surgeon gives you permission. In the event that your dressing becomes wet, dirty, or starts to smell, change the dressing and call your surgeon for instructions as soon as possible. (You may shower and allow soap and water to run over your incision)  Take showers. Do not take tub baths, swim, or do anything that may soak the wound until it is healed.  Resume your normal diet and activities as directed or allowed.  Avoid lifting any weight until you are instructed otherwise.  Use anti-itch antihistamine medicine as directed by your caregiver. The wound may itch when it is healing. Do not pick or scratch at the wound.  Drink enough fluids to keep your urine clear or pale yellow. SEEK MEDICAL CARE IF:   You have redness, swelling, or increasing pain in the wound that is not controlled with medicine.  You have drainage, blood, or pus coming from the wound that lasts longer than 1 day.  You develop muscle aches, chills, or a general ill feeling.  You notice a bad smell coming from the wound or dressing.  Your wound edges separate after the sutures, staples, or skin adhesive strips have been removed.  You develop persistent nausea or vomiting. SEEK IMMEDIATE MEDICAL CARE IF:   You have a fever.  You develop a rash.  You develop dizzy episodes or faint while standing.  You have difficulty breathing.  You develop any reaction or side effects to medicine given. MAKE SURE YOU:   Understand these instructions.  Will watch your condition.  Will get help right away if you  are not doing well or get worse. Document Released: 02/03/2005 Document Revised: 10/09/2011 Document Reviewed: 09/10/2013 [facility] Patient Information 2015 [place], [place]. This information is not intended to replace advice given to you by your health care provider. Make sure you discuss any questions you have with your health care provider.  Hypertension During Pregnancy Hypertension, or high blood pressure, is when there is extra pressure inside your blood vessels that carry blood from the heart to the rest of your body (arteries). It can happen at any time in life, including pregnancy. Hypertension during pregnancy can cause problems for you and your baby. Your baby might not weigh as much as he or she should at birth or might be born early (premature). Very bad cases of hypertension during pregnancy can be life-threatening.  Different types of hypertension can occur during pregnancy. These include:  Chronic hypertension. This happens when a woman has hypertension before pregnancy and it continues during pregnancy.  Gestational hypertension. This is when hypertension develops during pregnancy.  Preeclampsia or toxemia of pregnancy. This is a very serious type of hypertension that develops only during pregnancy. It affects the whole body and can be very dangerous for both mother and baby.  Gestational hypertension and preeclampsia usually go away after your baby is born. Your blood pressure will likely stabilize within 6 weeks. Women who have hypertension during pregnancy have a greater chance of developing hypertension later in life or with future pregnancies. RISK FACTORS There are certain factors that make it more likely  for you to develop hypertension during pregnancy. These include:  Having hypertension before pregnancy.  Having hypertension during a previous pregnancy.  Being overweight.  Being older than 40 years.  Being pregnant with more than one baby.  Having diabetes or  kidney problems. SIGNS AND SYMPTOMS Chronic and gestational hypertension rarely cause symptoms. Preeclampsia has symptoms, which may include:  Increased protein in your urine. Your health care provider will check for this at every prenatal visit.  Swelling of your hands and face.  Rapid weight gain.  Headaches.  Visual changes.  Being bothered by light.  Abdominal pain, especially in the upper right area.  Chest pain.  Shortness of breath.  Increased reflexes.  Seizures. These occur with a more severe form of preeclampsia, called eclampsia. DIAGNOSIS  You may be diagnosed with hypertension during a regular prenatal exam. At each prenatal visit, you may have:  Your blood pressure checked.  A urine test to check for protein in your urine. The type of hypertension you are diagnosed with depends on when you developed it. It also depends on your specific blood pressure reading.  Developing hypertension before 20 weeks of pregnancy is consistent with chronic hypertension.  Developing hypertension after 20 weeks of pregnancy is consistent with gestational hypertension.  Hypertension with increased urinary protein is diagnosed as preeclampsia.  Blood pressure measurements that stay above 427 systolic or 062 diastolic are a sign of severe preeclampsia. TREATMENT Treatment for hypertension during pregnancy varies. Treatment depends on the type of hypertension and how serious it is.  If you take medicine for chronic hypertension, you may need to switch medicines.  Medicines called ACE inhibitors should not be taken during pregnancy.  Low-dose aspirin may be suggested for women who have risk factors for preeclampsia.  If you have gestational hypertension, you may need to take a blood pressure medicine that is safe during pregnancy. Your health care provider will recommend the correct medicine.  If you have severe preeclampsia, you may need to be in the hospital. Health care  providers will watch you and your baby very closely. You also may need to take medicine called magnesium sulfate to prevent seizures and lower blood pressure.  Sometimes, an early delivery is needed. This may be the case if the condition worsens. It would be done to protect you and your baby. The only cure for preeclampsia is delivery.  Your health care provider may recommend that you take one low-dose aspirin (81 mg) each day to help prevent high blood pressure during your pregnancy if you are at risk for preeclampsia. You may be at risk for preeclampsia if:  You had preeclampsia or eclampsia during a previous pregnancy.  Your baby did not grow as expected during a previous pregnancy.  You experienced preterm birth with a previous pregnancy.  You experienced a separation of the placenta from the uterus (placental abruption) during a previous pregnancy.  You experienced the loss of your baby during a previous pregnancy.  You are pregnant with more than one baby.  You have other medical conditions, such as diabetes or an autoimmune disease. HOME CARE INSTRUCTIONS  Schedule and keep all of your regular prenatal care appointments. This is important.  Take medicines only as directed by your health care provider. Tell your health care provider about all medicines you take.  Eat as little salt as possible.  Get regular exercise.  Do not drink alcohol.  Do not use tobacco products.  Do not drink products with caffeine.  Shanda Howells  on your left side when resting. SEEK IMMEDIATE MEDICAL CARE IF:  You have severe abdominal pain.  You have sudden swelling in your hands, ankles, or face.  You gain 4 pounds (1.8 kg) or more in 1 week.  You vomit repeatedly.  You have vaginal bleeding.  You do not feel your baby moving as much.  You have a headache.  You have blurred or double vision.  You have muscle twitching or spasms.  You have shortness of breath.  You have blue fingernails  or lips.  You have blood in your urine. MAKE SURE YOU:  Understand these instructions.  Will watch your condition.  Will get help right away if you are not doing well or get worse. Document Released: 04/04/2011 Document Revised: 12/01/2013 Document Reviewed: 02/13/2013 [facility] Patient Information 2015 [place], [place]. This information is not intended to replace advice given to you by your health care provider. Make sure you discuss any questions you have with your health care provider.

## 2015-04-01 NOTE — MAU Provider Note (Signed)
Chief Complaint: Wound Check   First Provider Initiated Contact with Patient 04/01/15 1359     SUBJECTIVE HPI: Wendy Floyd is a 34 y.o. [id number] 7 days status post repeat low transverse C-section on 03/25/2015 who presents to Maternity Admissions reporting opening of incision. Do not initially report pain to CNM, but reported aching 7/10 on the pain felt to RN. Pain is usually well controlled by ibuprofen, but patient has not taken ibuprofen today.   Location: Incision Quality: Aching Severity: 7/10 on pain scale Duration: One week Context: None Timing: Constant Modifying factors: None Associated signs and symptoms: No associated fever, chills, drainage or bleeding from incision. Scant vaginal bleeding.  Has history of chronic hypertension, but blood pressure was normal during pregnancy and she is not on any medications. When asked about preeclampsia symptoms she reported headache 5/10 on pain scale, same as her chronic headaches. Has not tried anything for the pain.  Past Medical History  Diagnosis Date  . Endometriosis   . Fibroids   . Anemia   . Hypertension    OB History  Gravida Para Term Preterm AB SAB TAB Ectopic Multiple Living  5 5 5  0 0 0 0 0 0 5    # Outcome Date GA Lbr Len/2nd Weight Sex Delivery Anes PTL Lv  5 Term 03/25/15 [redacted]w[redacted]d  6 lb 8.4 oz (2.96 kg) F CS-LTranv EPI  Y  4 Term      CS-LTranv   Y  3 Term      CS-LTranv   Y  2 Term      CS-LTranv   Y  1 Term      CS-LTranv   Y     Comments: fetal distress     Past Surgical History  Procedure Laterality Date  . Cesarean section      4 c/s prior   . Cesarean section N/A 03/25/2015    Procedure: CESAREAN SECTION;  Surgeon: Truett Mainland, DO;  Location: [facility];  Service: Obstetrics;  Laterality: N/A;  Requested 03/25/15 @ 11:00a   Social History   Social History  . Marital Status: Married    Spouse Name: N/A  . Number of Children: N/A  . Years of Education: N/A   Occupational History  . Not on file.    Social History Main Topics  . Smoking status: Former Smoker -- 0.50 packs/day    Quit date: 07/30/2014  . Smokeless tobacco: Never Used  . Alcohol Use: Yes  . Drug Use: Yes    Special: Marijuana     Comment: last year   . Sexual Activity: Yes    Birth Control/ Protection: None   Other Topics Concern  . Not on file   Social History Narrative   No current facility-administered medications on file prior to encounter.   Current Outpatient Prescriptions on File Prior to Encounter  Medication Sig Dispense Refill  . ibuprofen (ADVIL,MOTRIN) 600 MG tablet Take 1 tablet (600 mg total) by mouth every 6 (six) hours as needed. (Patient taking differently: Take 600 mg by mouth every 6 (six) hours as needed for moderate pain. ) 30 tablet 0  . oxyCODONE-acetaminophen (PERCOCET/ROXICET) 5-325 MG per tablet Take 1-2 tablets by mouth every 4 (four) hours as needed (for pain scale 4-7). 30 tablet 0  . Prenatal Vit-Fe Fumarate-FA (PRENATAL MULTIVITAMIN) TABS tablet Take 1 tablet by mouth daily at 12 noon.    Marland Kitchen albuterol (PROVENTIL HFA;VENTOLIN HFA) 108 (90 BASE) MCG/ACT inhaler Inhale 2 puffs into the  lungs every 6 (six) hours as needed for wheezing or shortness of breath. 1 Inhaler 0   No Known Allergies  I have reviewed the past Medical Hx, Surgical Hx, Social Hx, Allergies and Medications.   Review of Systems  Constitutional: Negative for fever and chills.  Eyes: Negative for visual disturbance.  Gastrointestinal: Negative for abdominal pain.  Genitourinary: Positive for vaginal bleeding (Scant). Negative for dysuria, urgency, frequency, hematuria, vaginal discharge and pelvic pain.  Skin: Positive for wound (opening at left end of C-section incision).       Incision pain.  Neurological: Positive for headaches. Numbness: generalized.    OBJECTIVE Patient Vitals for the past 24 hrs:  BP Temp Temp src Pulse  04/01/15 1445 132/81 mmHg - - -  04/01/15 1416 130/79 mmHg - - 85  04/01/15 1315  143/83 mmHg 98.7 F (37.1 C) Oral 90   Constitutional: Well-developed, well-nourished female in no acute distress.  Cardiovascular: normal rate Respiratory: normal rate and effort.  GI: Abd soft, non-tender. Incision well approximated. 3 areas 2-3 cm long of granulation tissue noted. No drainage, bleeding, erythema along the incision. Mild, appropriate tenderness.  MS: Extremities nontender, no edema, normal ROM Neurologic: Alert and oriented x 4.   LAB RESULTS N/A  IMAGING N/A   MAU COURSE Initial blood pressure mildly elevated, but repeat blood pressures normal. Ibuprofen given. Pain much better.  MDM  34 year old female 1 week status post repeat C-section with normal healing of C-section incision. With patient thought may have been separation of incision was actually closed. Granulation tissue is visible. Patient has history of chronic hypertension and chronic headaches. Mildly elevated pressure 1 today. Not concerned for postpartum preeclampsia, but precautions reviewed.  ASSESSMENT 1. Presence of granulation tissue   2. Visit for wound check   3. Chronic hypertension in obstetric context, unspecified trimester     PLAN Discharge home in stable condition. Preeclampsia Precautions. Wound care and normal process of healing reviewed.  Follow-up Information    Follow up with [facility] [place] In 5 weeks.   Why:  For postpartum visit or sooner as needed if symptoms worsen   Contact information:   [place]      Follow up with [facility].   Why:  As needed in emergencies   Contact information:   [address] [id number] [place] [phone]       Medication List    TAKE these medications        albuterol 108 (90 BASE) MCG/ACT inhaler  Commonly known as:  PROVENTIL HFA;VENTOLIN HFA  Inhale 2 puffs into the lungs every 6  (six) hours as needed for wheezing or shortness of breath.     ibuprofen 600 MG tablet  Commonly known as:  ADVIL,MOTRIN  Take 1 tablet (600 mg total) by mouth every 6 (six) hours as needed.     oxyCODONE-acetaminophen 5-325 MG per tablet  Commonly known as:  PERCOCET/ROXICET  Take 1-2 tablets by mouth every 4 (four) hours as needed (for pain scale 4-7).     prenatal multivitamin Tabs tablet  Take 1 tablet by mouth daily at 12 noon.       Conde, CNM 04/01/2015  3:08 PM

## 2015-05-12 ENCOUNTER — Ambulatory Visit: Payer: Self-pay | Admitting: Family

## 2015-05-26 ENCOUNTER — Ambulatory Visit: Payer: Self-pay | Admitting: Advanced Practice Midwife

## 2015-06-02 ENCOUNTER — Encounter: Payer: Self-pay | Admitting: Family

## 2015-06-02 ENCOUNTER — Ambulatory Visit (INDEPENDENT_AMBULATORY_CARE_PROVIDER_SITE_OTHER): Payer: Medicaid Other | Admitting: Family

## 2015-06-02 NOTE — Progress Notes (Signed)
Patient ID: Wendy Floyd, female   DOB: [birth year], 34 y.o.   MRN: [id number] Subjective:     Wendy Floyd is a 34 y.o. female who presents for a postpartum visit. She is 10 weeks postpartum following a low cervical transverse Cesarean section. I have fully reviewed the prenatal and intrapartum course. The delivery was at 65 gestational weeks. Outcome: repeat cesarean section, low transverse incision. Anesthesia: spinal. Postpartum course has been unremarkable. Baby's course has been unremarkable. Baby is feeding by bottle - Similac Advance. Bleeding staining only. Bowel function is normal. Bladder function is normal. Patient is not sexually active. Contraception method is none. Postpartum depression screening: negative.  The following portions of the patient's history were reviewed and updated as appropriate: allergies, current medications, past family history, past medical history, past social history, past surgical history and problem list.  Review of Systems Pertinent items are noted in HPI.   Objective:    BP 124/84 mmHg  Pulse 63  Temp(Src) 98.5 F (36.9 C) (Oral)  Ht 5\' 8"  (1.727 m)  Wt 201 lb 8 oz (91.4 kg)  BMI 30.65 kg/m2  LMP 05/29/2015 (Exact Date)  Breastfeeding? No  General:  alert, cooperative and appears stated age   Breasts:  inspection negative, no nipple discharge or bleeding, no masses or nodularity palpable  Lungs: clear to auscultation bilaterally  Heart:  regular rate and rhythm, S1, S2 normal, no murmur, click, rub or gallop  Abdomen: soft, non-tender; bowel sounds normal; no masses,  no organomegaly; csection scar well healed, no signs of infection.  Uterus involuted.    Pelvic exam not indicated - not bleeding, no pain at vaginal area      Assessment:     Normal postpartum exam. Pap smear not done at today's visit.  Reports it was done in [place] - normal   Plan:    1. Contraception: none.  Pt given information regarding various options.   2. . Follow  up  as needed.    Venia Carbon Michiel Cowboy, CNM

## 2015-08-04 ENCOUNTER — Encounter (HOSPITAL_COMMUNITY): Payer: Self-pay | Admitting: *Deleted

## 2015-08-04 ENCOUNTER — Inpatient Hospital Stay (HOSPITAL_COMMUNITY)
Admission: AD | Admit: 2015-08-04 | Discharge: 2015-08-04 | Disposition: A | Discharge status: Home / Self Care | Payer: Medicaid Other | Source: Ambulatory Visit | Attending: Family Medicine | Admitting: Family Medicine

## 2015-08-04 DIAGNOSIS — I10 Essential (primary) hypertension: Secondary | ICD-10-CM | POA: Insufficient documentation

## 2015-08-04 DIAGNOSIS — R102 Pelvic and perineal pain: Secondary | ICD-10-CM | POA: Diagnosis present

## 2015-08-04 DIAGNOSIS — N760 Acute vaginitis: Secondary | ICD-10-CM | POA: Diagnosis not present

## 2015-08-04 DIAGNOSIS — B9689 Other specified bacterial agents as the cause of diseases classified elsewhere: Secondary | ICD-10-CM | POA: Insufficient documentation

## 2015-08-04 DIAGNOSIS — A499 Bacterial infection, unspecified: Secondary | ICD-10-CM

## 2015-08-04 DIAGNOSIS — Z87891 Personal history of nicotine dependence: Secondary | ICD-10-CM | POA: Insufficient documentation

## 2015-08-04 DIAGNOSIS — R590 Localized enlarged lymph nodes: Secondary | ICD-10-CM | POA: Diagnosis not present

## 2015-08-04 LAB — URINALYSIS, ROUTINE W REFLEX MICROSCOPIC
BILIRUBIN URINE: NEGATIVE
GLUCOSE, UA: NEGATIVE mg/dL
Hgb urine dipstick: NEGATIVE
KETONES UR: NEGATIVE mg/dL
Leukocytes, UA: NEGATIVE
NITRITE: NEGATIVE
PH: 7.5 (ref 5.0–8.0)
Protein, ur: NEGATIVE mg/dL
Specific Gravity, Urine: 1.015 (ref 1.005–1.030)

## 2015-08-04 LAB — CBC
HEMATOCRIT: 34.5 % — AB (ref 36.0–46.0)
HEMOGLOBIN: 10.9 g/dL — AB (ref 12.0–15.0)
MCH: 26.3 pg (ref 26.0–34.0)
MCHC: 31.6 g/dL (ref 30.0–36.0)
MCV: 83.3 fL (ref 78.0–100.0)
Platelets: 353 10*3/uL (ref 150–400)
RBC: 4.14 MIL/uL (ref 3.87–5.11)
RDW: 15.7 % — ABNORMAL HIGH (ref 11.5–15.5)
WBC: 7.6 10*3/uL (ref 4.0–10.5)

## 2015-08-04 LAB — POCT PREGNANCY, URINE: Preg Test, Ur: NEGATIVE

## 2015-08-04 LAB — WET PREP, GENITAL
SPERM: NONE SEEN
Trich, Wet Prep: NONE SEEN
Yeast Wet Prep HPF POC: NONE SEEN

## 2015-08-04 MED ORDER — METRONIDAZOLE 500 MG PO TABS
500.0000 mg | ORAL_TABLET | Freq: Two times a day (BID) | ORAL | Status: DC
Start: 2015-08-04 — End: 2017-01-02

## 2015-08-04 MED ORDER — IBUPROFEN 600 MG PO TABS: 600.0000 mg | ORAL_TABLET | Freq: Four times a day (QID) | ORAL | Status: DC | PRN

## 2015-08-04 MED ORDER — KETOROLAC TROMETHAMINE 60 MG/2ML IM SOLN
60.0000 mg | INTRAMUSCULAR | Status: AC
  Administered 2015-08-04: 60 mg via INTRAMUSCULAR
  Filled 2015-08-04: qty 2

## 2015-08-04 NOTE — MAU Provider Note (Signed)
Chief Complaint: Pelvic Pain   First Provider Initiated Contact with Patient 08/04/15 1053      SUBJECTIVE HPI: Wendy Floyd is a 35 y.o. [id number] who presents to maternity admissions reporting pain in her left inguinal area x 2-3 days with swollen lymph node per the pt. She reports a knot in her left groin area that is painful to touch, increases in pain with walking or urination/bowel movements. She has taken Tylenol which has not improved her symptoms.  She reports she has had this before when she had bacterial vaginosis.  She denies known exposure to STIs but would like to be tested.   She denies vaginal bleeding, vaginal itching/burning, urinary symptoms, h/a, dizziness, n/v, or fever/chills.     HPI  Past Medical History  Diagnosis Date  . Endometriosis   . Fibroids   . Anemia   . Hypertension    Past Surgical History  Procedure Laterality Date  . Cesarean section      4 c/s prior   . Cesarean section N/A 03/25/2015    Procedure: CESAREAN SECTION;  Surgeon: Truett Mainland, DO;  Location: [facility];  Service: Obstetrics;  Laterality: N/A;  Requested 03/25/15 @ 11:00a   Social History   Social History  . Marital Status: Married    Spouse Name: N/A  . Number of Children: N/A  . Years of Education: N/A   Occupational History  . Not on file.   Social History Main Topics  . Smoking status: Former Smoker -- 0.50 packs/day    Quit date: 07/30/2014  . Smokeless tobacco: Never Used  . Alcohol Use: Yes  . Drug Use: Yes    Special: Marijuana     Comment: last year   . Sexual Activity: Yes    Birth Control/ Protection: None   Other Topics Concern  . Not on file   Social History Narrative   No current facility-administered medications on file prior to encounter.   Current Outpatient Prescriptions on File Prior to Encounter  Medication Sig Dispense Refill  . albuterol (PROVENTIL HFA;VENTOLIN HFA) 108 (90 BASE) MCG/ACT inhaler Inhale 2 puffs into the lungs every 6 (six)  hours as needed for wheezing or shortness of breath. (Patient not taking: Reported on 06/02/2015) 1 Inhaler 0  . ibuprofen (ADVIL,MOTRIN) 600 MG tablet Take 1 tablet (600 mg total) by mouth every 6 (six) hours as needed. (Patient not taking: Reported on 06/02/2015) 30 tablet 0  . oxyCODONE-acetaminophen (PERCOCET/ROXICET) 5-325 MG per tablet Take 1-2 tablets by mouth every 4 (four) hours as needed (for pain scale 4-7). (Patient not taking: Reported on 06/02/2015) 30 tablet 0  . Prenatal Vit-Fe Fumarate-FA (PRENATAL MULTIVITAMIN) TABS tablet Take 1 tablet by mouth daily at 12 noon.     No Known Allergies  ROS:  Review of Systems  Constitutional: Negative for fever, chills and fatigue.  Respiratory: Negative for shortness of breath.   Cardiovascular: Negative for chest pain.  Gastrointestinal: Negative for abdominal pain.  Genitourinary: Positive for vaginal discharge and pelvic pain. Negative for dysuria, flank pain, vaginal bleeding, difficulty urinating and vaginal pain.  Neurological: Negative for dizziness and headaches.  Psychiatric/Behavioral: Negative.      I have reviewed patient's Past Medical Hx, Surgical Hx, Family Hx, Social Hx, medications and allergies.   Physical Exam  Patient Vitals for the past 24 hrs:  BP Temp Temp src Pulse Resp Height Weight  08/04/15 1003 132/71 mmHg 97.6 F (36.4 C) Oral (!) 57 18 5\' 9"  (1.753 m)  208 lb 3.2 oz (94.439 kg)   Constitutional: Well-developed, well-nourished female in no acute distress.  Cardiovascular: normal rate Respiratory: normal effort GI: Abd soft, non-tender. Pos BS x 4 MS: Extremities nontender, no edema, normal ROM Neurologic: Alert and oriented x 4.  GU: Neg CVAT.  PELVIC EXAM: Cervix pink, visually closed, without lesion, small amount malodorous white thin discharge, vaginal walls and external genitalia normal Bimanual exam: Cervix 0/long/high, firm, anterior, neg CMT, uterus nontender, nonenlarged, adnexa without  tenderness, enlargement, or mass, palpable inguinal lymph node enlargement, with left side ~4-5 cm in diameter, right side enlarged as well ~3-4 cm.   LAB RESULTS Results for orders placed or performed during the hospital encounter of 08/04/15 (from the past 24 hour(s))  Urinalysis, Routine w reflex microscopic (not at [facility])     Status: Abnormal   Collection Time: 08/04/15 10:25 AM  Result Value Ref Range   Color, Urine YELLOW YELLOW   APPearance HAZY (A) CLEAR   Specific Gravity, Urine 1.015 1.005 - 1.030   pH 7.5 5.0 - 8.0   Glucose, UA NEGATIVE NEGATIVE mg/dL   Hgb urine dipstick NEGATIVE NEGATIVE   Bilirubin Urine NEGATIVE NEGATIVE   Ketones, ur NEGATIVE NEGATIVE mg/dL   Protein, ur NEGATIVE NEGATIVE mg/dL   Nitrite NEGATIVE NEGATIVE   Leukocytes, UA NEGATIVE NEGATIVE  Pregnancy, urine POC     Status: None   Collection Time: 08/04/15 10:28 AM  Result Value Ref Range   Preg Test, Ur NEGATIVE NEGATIVE  Wet prep, genital     Status: Abnormal   Collection Time: 08/04/15 11:52 AM  Result Value Ref Range   Yeast Wet Prep HPF POC NONE SEEN NONE SEEN   Trich, Wet Prep NONE SEEN NONE SEEN   Clue Cells Wet Prep HPF POC PRESENT (A) NONE SEEN   WBC, Wet Prep HPF POC FEW (A) NONE SEEN   Sperm NONE SEEN   CBC     Status: Abnormal   Collection Time: 08/04/15 12:02 PM  Result Value Ref Range   WBC 7.6 4.0 - 10.5 K/uL   RBC 4.14 3.87 - 5.11 MIL/uL   Hemoglobin 10.9 (L) 12.0 - 15.0 g/dL   HCT 34.5 (L) 36.0 - 46.0 %   MCV 83.3 78.0 - 100.0 fL   MCH 26.3 26.0 - 34.0 pg   MCHC 31.6 30.0 - 36.0 g/dL   RDW 15.7 (H) 11.5 - 15.5 %   Platelets 353 150 - 400 K/uL    --/--/O POS (08/24 1235)  IMAGING No results found.  MAU Management/MDM: Ordered labs and reviewed results.  Treatments in MAU included Toradol 60 mg IM x 1 with significant improvement in pain. Pt stable at time of discharge.    ASSESSMENT 1. BV (bacterial vaginosis)   2. Inguinal lymphadenopathy     PLAN Discharge  home Ibuprofen 600 mg PO Q 6 hours PRN Flagyl 500 mg BID x 7 days       Follow-up Information    Follow up with [facility].   Specialty:  Obstetrics and Gynecology   Why:  As needed, Return to MAU as needed for emergencies   Contact information:   [place] Certified Nurse-Midwife 08/04/2015  12:51 PM

## 2015-08-04 NOTE — MAU Note (Signed)
Pt on phone 

## 2015-08-04 NOTE — MAU Provider Note (Signed)
This is a Ship [employer] note.  See my complete note on the same date.    Fatima Blank, CNM 6:07 PM History     CSN: [id number]  Arrival date and time: 08/04/15 [id number]   First Provider Initiated Contact with Patient 08/04/15 1053      Chief Complaint  Patient presents with  . Pelvic Pain   Pelvic Pain The patient's primary symptoms include pelvic pain. This is a new problem. The current episode started in the past 7 days. The problem occurs daily. The problem has been gradually worsening. The pain is moderate. The problem affects both sides. She is not pregnant. Associated symptoms include dysuria (slight) and frequency (intermittent). Pertinent negatives include no abdominal pain, fever, flank pain, nausea or urgency. The symptoms are aggravated by activity and bowel movements. She has tried acetaminophen for the symptoms. The treatment provided no relief. She is sexually active. She uses oral contraceptives for contraception. Her past medical history is significant for a Cesarean section.     Past Medical History  Diagnosis Date  . Endometriosis   . Fibroids   . Anemia   . Hypertension     Past Surgical History  Procedure Laterality Date  . Cesarean section      4 c/s prior   . Cesarean section N/A 03/25/2015    Procedure: CESAREAN SECTION;  Surgeon: Truett Mainland, DO;  Location: [facility];  Service: Obstetrics;  Laterality: N/A;  Requested 03/25/15 @ 11:00a    Family History  Problem Relation Age of Onset  . Diabetes Mother   . Hypertension Mother   . Diabetes Father   . Hearing loss Paternal Grandmother     Social History  Substance Use Topics  . Smoking status: Former Smoker -- 0.50 packs/day    Quit date: 07/30/2014  . Smokeless tobacco: Never Used  . Alcohol Use: Yes    Allergies: No Known Allergies  Prescriptions prior to admission  Medication Sig Dispense Refill Last Dose  . albuterol (PROVENTIL HFA;VENTOLIN HFA) 108 (90 BASE) MCG/ACT inhaler Inhale 2  puffs into the lungs every 6 (six) hours as needed for wheezing or shortness of breath. (Patient not taking: Reported on 06/02/2015) 1 Inhaler 0 Not Taking  . ibuprofen (ADVIL,MOTRIN) 600 MG tablet Take 1 tablet (600 mg total) by mouth every 6 (six) hours as needed. (Patient not taking: Reported on 06/02/2015) 30 tablet 0 Not Taking  . oxyCODONE-acetaminophen (PERCOCET/ROXICET) 5-325 MG per tablet Take 1-2 tablets by mouth every 4 (four) hours as needed (for pain scale 4-7). (Patient not taking: Reported on 06/02/2015) 30 tablet 0 Not Taking  . Prenatal Vit-Fe Fumarate-FA (PRENATAL MULTIVITAMIN) TABS tablet Take 1 tablet by mouth daily at 12 noon.   Not Taking    Review of Systems  Constitutional: Negative for fever.  Gastrointestinal: Negative for nausea and abdominal pain.  Genitourinary: Positive for dysuria (slight), frequency (intermittent) and pelvic pain. Negative for urgency and flank pain.  Skin: Negative.    Physical Exam   Blood pressure 132/71, pulse 57, temperature 97.6 F (36.4 C), temperature source Oral, resp. rate 18, height 5\' 9"  (1.753 m), weight 94.439 kg (208 lb 3.2 oz), last menstrual period 08/14/2014, not currently breastfeeding.  Physical Exam  Constitutional: She appears well-developed and well-nourished.  Cardiovascular: Normal rate.   Respiratory: Effort normal. No respiratory distress.  GI: Soft. There is no tenderness.  Genitourinary: Vaginal discharge found.  Palpable inguinal lymph nodes bilaterally. Left node larger, olive-sized.  Right node is grape-sized. No visible  injury. No surrounding erythema or warmth.    Skin: Skin is warm and dry. No rash noted. No erythema.    MAU Course  Procedures  MDM Patient reports swollen lymph nodes which are palpable and painful especially with walking. Labs ordered to include CBC, UA, wet prep, HIV, and RPR testing. Toradol for pain and swelling. Suspect infectious process. Follow labs/cultures.  Assessment and Plan   Bacterial vaginosis; Metronidazole 500 mg BID PO x7 days.  Inguinal lymphadenopathy; May use ibuprofen for discomfort. Return if symptoms worsen or develop fever >101.5. Will notify if any remaining labs results positive.  Brynda Greathouse 08/04/2015, 12:09 PM   I have seen this patient and agree with the above student's note.  See my provider note on the same date.  LEFTWICH-KIRBY, [place] Certified Nurse-Midwife

## 2015-08-04 NOTE — MAU Note (Signed)
Started having pelvic pain about a wk ago, noted swelling in groin about the same time. Worse when she walk

## 2015-08-04 NOTE — Discharge Instructions (Signed)
Bacterial Vaginosis Bacterial vaginosis is a vaginal infection that occurs when the normal balance of bacteria in the vagina is disrupted. It results from an overgrowth of certain bacteria. This is the most common vaginal infection in women of childbearing age. Treatment is important to prevent complications, especially in pregnant women, as it can cause a premature delivery. CAUSES  Bacterial vaginosis is caused by an increase in harmful bacteria that are normally present in smaller amounts in the vagina. Several different kinds of bacteria can cause bacterial vaginosis. However, the reason that the condition develops is not fully understood. RISK FACTORS Certain activities or behaviors can put you at an increased risk of developing bacterial vaginosis, including:  Having a new sex partner or multiple sex partners.  Douching.  Using an intrauterine device (IUD) for contraception. Women do not get bacterial vaginosis from toilet seats, bedding, swimming pools, or contact with objects around them. SIGNS AND SYMPTOMS  Some women with bacterial vaginosis have no signs or symptoms. Common symptoms include:  Grey vaginal discharge.  A fishlike odor with discharge, especially after sexual intercourse.  Itching or burning of the vagina and vulva.  Burning or pain with urination. DIAGNOSIS  Your health care provider will take a medical history and examine the vagina for signs of bacterial vaginosis. A sample of vaginal fluid may be taken. Your health care provider will look at this sample under a microscope to check for bacteria and abnormal cells. A vaginal pH test may also be done.  TREATMENT  Bacterial vaginosis may be treated with antibiotic medicines. These may be given in the form of a pill or a vaginal cream. A second round of antibiotics may be prescribed if the condition comes back after treatment. Because bacterial vaginosis increases your risk for sexually transmitted diseases, getting  treated can help reduce your risk for chlamydia, gonorrhea, HIV, and herpes. HOME CARE INSTRUCTIONS   Only take over-the-counter or prescription medicines as directed by your health care provider.  If antibiotic medicine was prescribed, take it as directed. Make sure you finish it even if you start to feel better.  Tell all sexual partners that you have a vaginal infection. They should see their health care provider and be treated if they have problems, such as a mild rash or itching.  During treatment, it is important that you follow these instructions:  Avoid sexual activity or use condoms correctly.  Do not douche.  Avoid alcohol as directed by your health care provider.  Avoid breastfeeding as directed by your health care provider. SEEK MEDICAL CARE IF:   Your symptoms are not improving after 3 days of treatment.  You have increased discharge or pain.  You have a fever. MAKE SURE YOU:   Understand these instructions.  Will watch your condition.  Will get help right away if you are not doing well or get worse. FOR MORE INFORMATION  Centers for Disease Control and Prevention, Division of STD Prevention: [employer] American Sexual Health Association (ASHA): www.ashastd.org    This information is not intended to replace advice given to you by your health care provider. Make sure you discuss any questions you have with your health care provider.   Document Released: 07/17/2005 Document Revised: 08/07/2014 Document Reviewed: 02/26/2013 Elsevier Interactive Patient Education 2016 Elsevier Inc. Lymphadenopathy Lymphadenopathy refers to swollen or enlarged lymph glands, also called lymph nodes. Lymph glands are part of your body's defense (immune) system, which protects the body from infections, germs, and diseases. Lymph glands are found in  many locations in your body, including the neck, underarm, and groin.  Many things can cause lymph glands to become enlarged. When  your immune system responds to germs, such as viruses or bacteria, infection-fighting cells and fluid build up. This causes the glands to grow in size. Usually, this is not something to worry about. The swelling and any soreness often go away without treatment. However, swollen lymph glands can also be caused by a number of diseases. Your health care provider may do various tests to help determine the cause. If the cause of your swollen lymph glands cannot be found, it is important to monitor your condition to make sure the swelling goes away. HOME CARE INSTRUCTIONS Watch your condition for any changes. The following actions may help to lessen any discomfort you are feeling:  Get plenty of rest.  Take medicines only as directed by your health care provider. Your health care provider may recommend over-the-counter medicines for pain.  Apply moist heat compresses to the site of swollen lymph nodes as directed by your health care provider. This can help reduce any pain.  Check your lymph nodes daily for any changes.  Keep all follow-up visits as directed by your health care provider. This is important. SEEK MEDICAL CARE IF:  Your lymph nodes are still swollen after 2 weeks.  Your swelling increases or spreads to other areas.  Your lymph nodes are hard, seem fixed to the skin, or are growing rapidly.  Your skin over the lymph nodes is red and inflamed.  You have a fever.  You have chills.  You have fatigue.  You develop a sore throat.  You have abdominal pain.  You have weight loss.  You have night sweats. SEEK IMMEDIATE MEDICAL CARE IF:  You notice fluid leaking from the area of the enlarged lymph node.  You have severe pain in any area of your body.  You have chest pain.  You have shortness of breath.   This information is not intended to replace advice given to you by your health care provider. Make sure you discuss any questions you have with your health care provider.    Document Released: 04/25/2008 Document Revised: 08/07/2014 Document Reviewed: 02/19/2014 Elsevier Interactive Patient Education [employer].

## 2015-08-04 NOTE — MAU Note (Signed)
Pt returned to lobby to finish phone call

## 2015-08-05 LAB — GC/CHLAMYDIA PROBE AMP (~~LOC~~) NOT AT ARMC
CHLAMYDIA, DNA PROBE: NEGATIVE
NEISSERIA GONORRHEA: NEGATIVE

## 2015-08-05 LAB — RPR: RPR Ser Ql: NONREACTIVE

## 2015-08-05 LAB — HIV ANTIBODY (ROUTINE TESTING W REFLEX): HIV Screen 4th Generation wRfx: NONREACTIVE

## 2015-12-18 IMAGING — US US OB TRANSVAGINAL
1 series · 14 of 28 positions shown · non-contrast
Comparison: Ultrasound 07/30/2014 demonstrating intrauterine
gestational sac but no yolk sac or fetal pole.

CLINICAL DATA: Abdominal pain in pregnancy.  Follow up viability.

EXAM:
TRANSVAGINAL OB ULTRASOUND
TECHNIQUE: Transvaginal ultrasound was performed for complete evaluation of the
gestation as well as the maternal uterus, adnexal regions, and
pelvic cul-de-sac.

[Series 1: us ob transvaginal · 37 acquisitions, 14 frames shown]
[im 2/37]
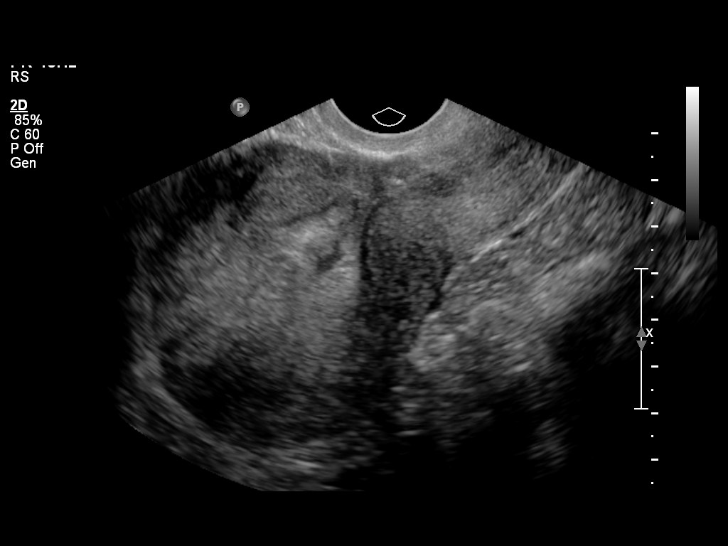
[im 5/37]
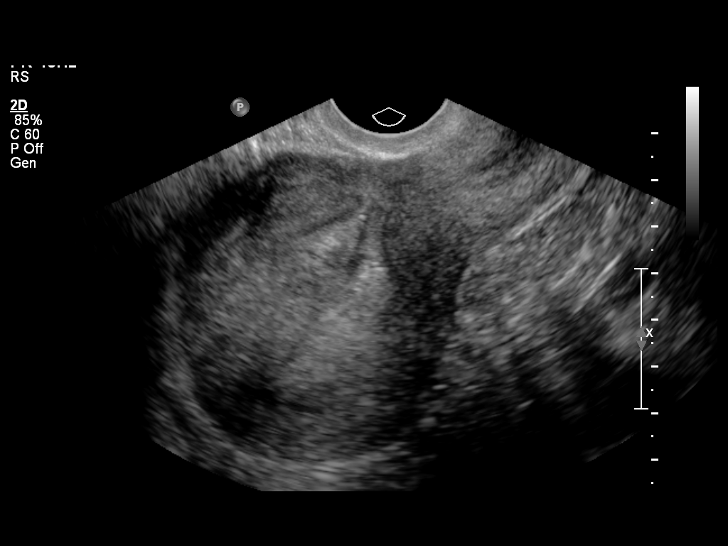
[im 7/37]
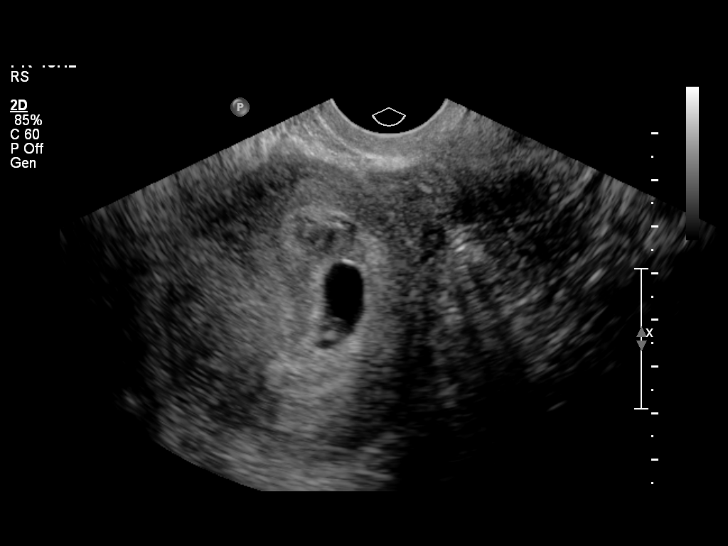
[im 10/37]
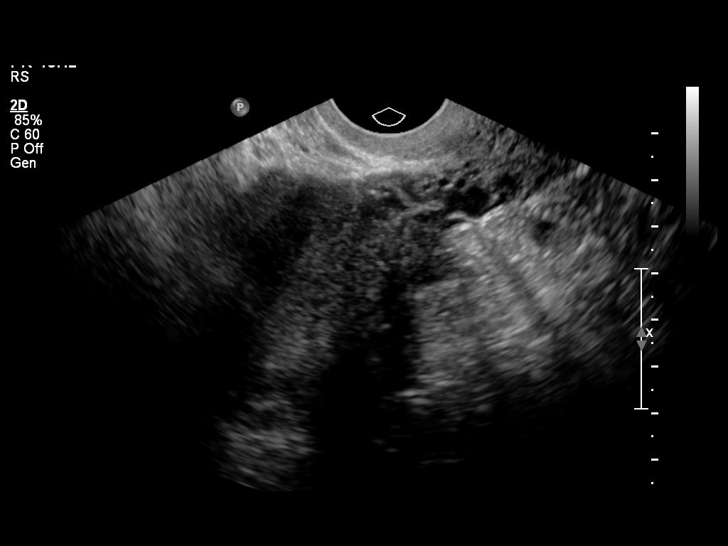
[im 13/37]
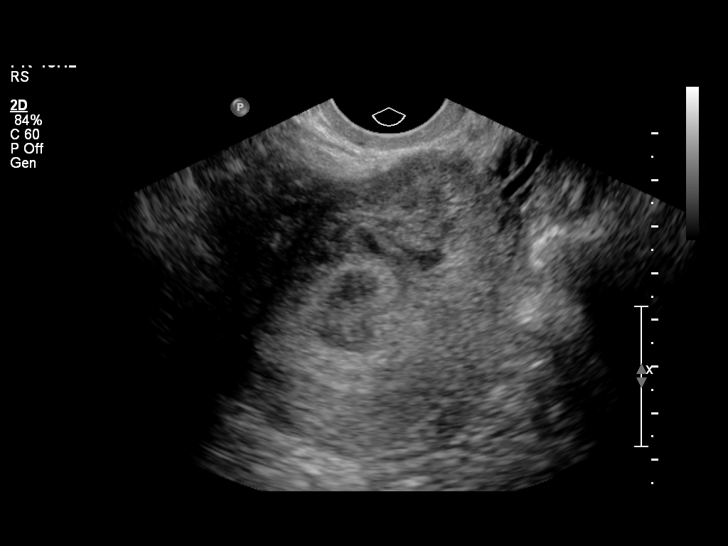
[im 15/37]
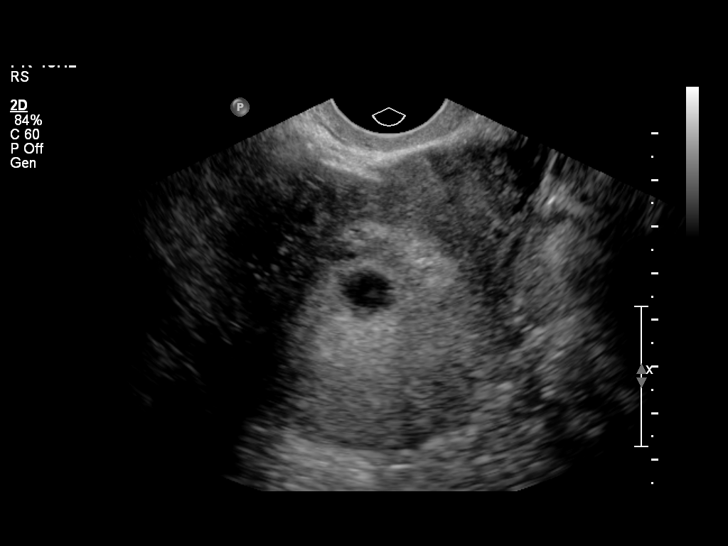
[im 18/37]
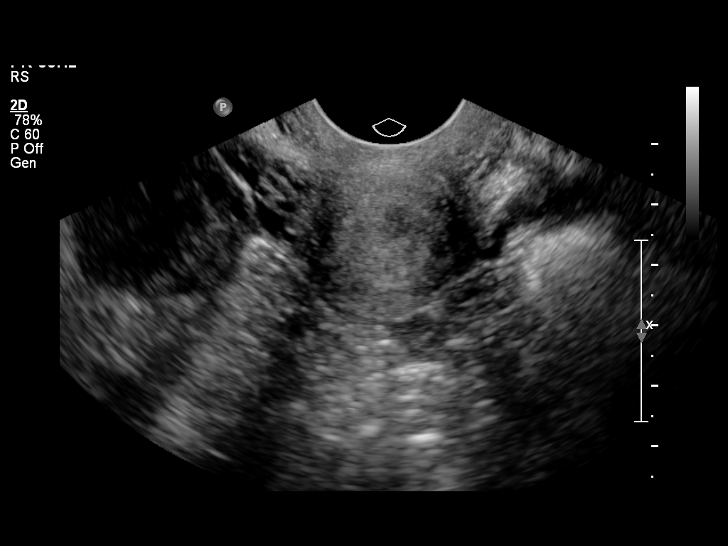
[im 21/37]
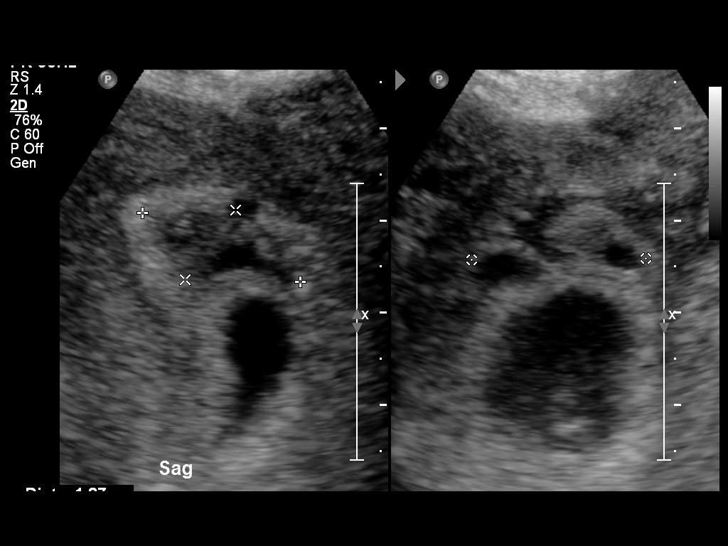
[im 23/37]
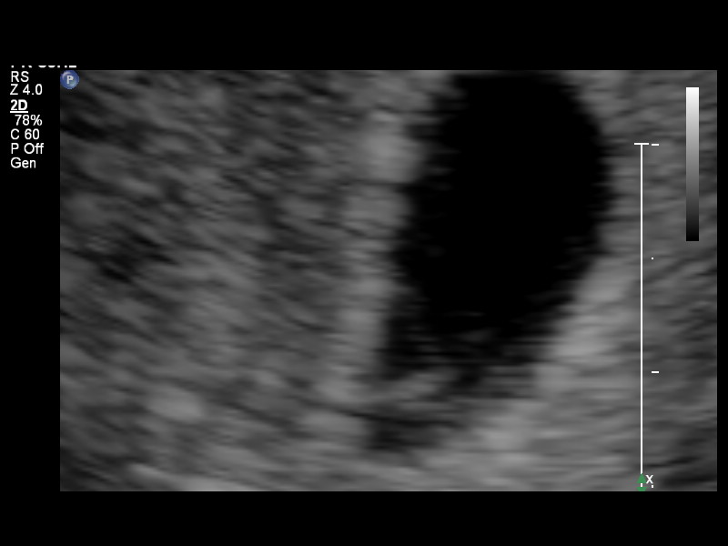
[im 26/37]
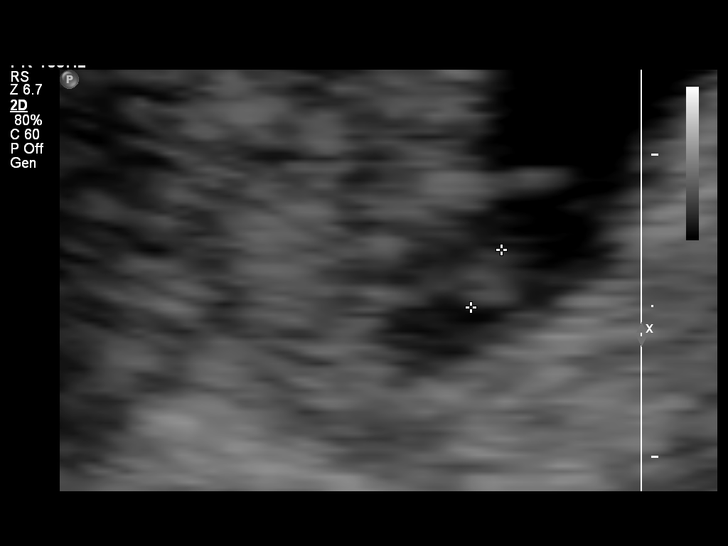
[im 29/37]
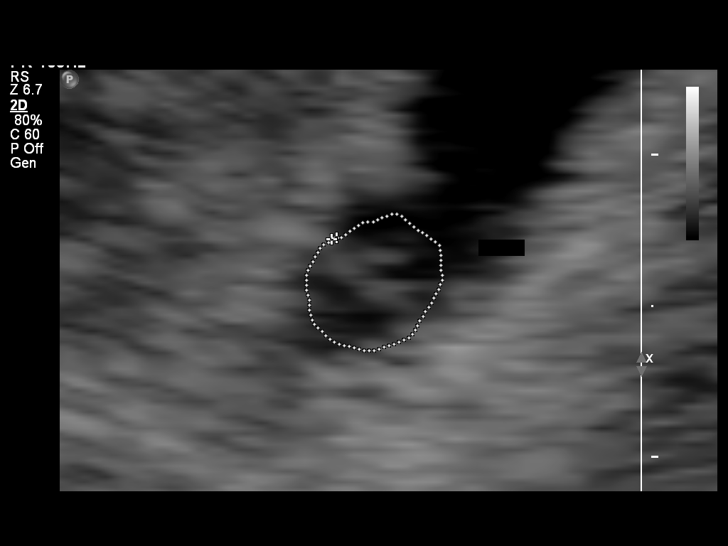
[im 31/37]
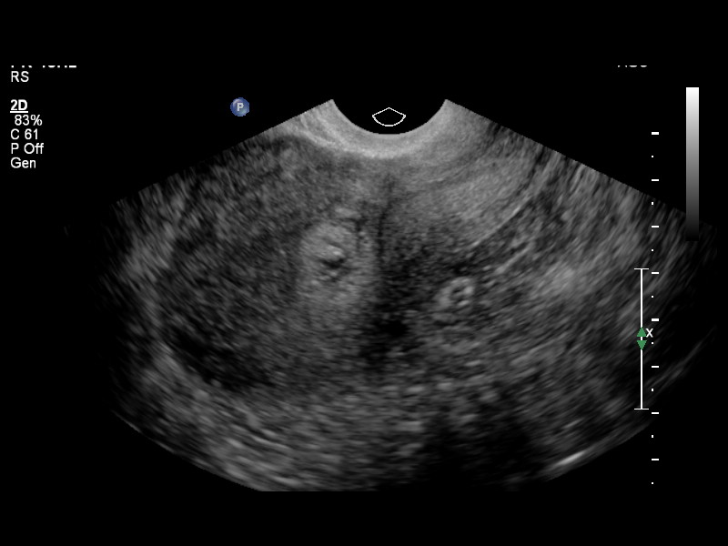
[im 34/37]
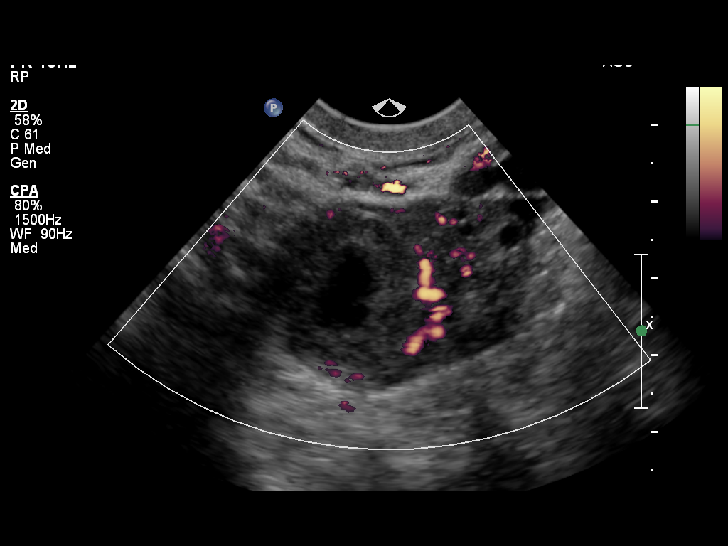
[im 37/37]
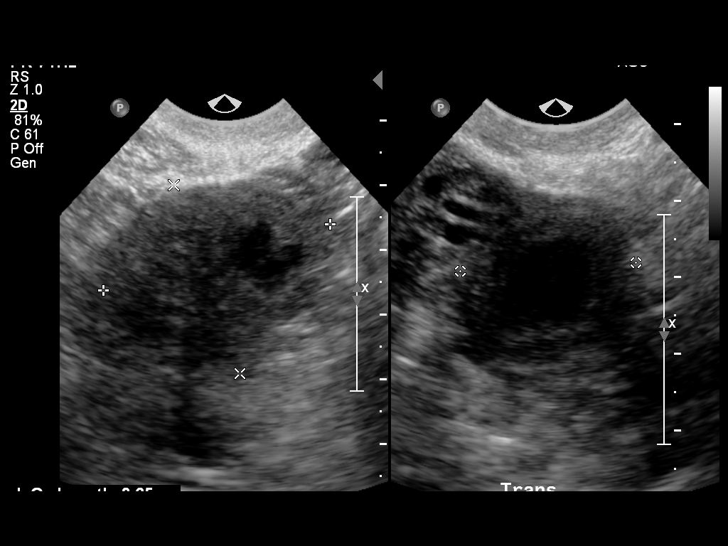

[14 of 28 positions shown; findings below may reference images not displayed]

FINDINGS: Intrauterine gestational sac: Visualized/normal in shape. There is a
small subchorionic hemorrhage measuring 1.9 x 0.9 x 1.9 cm.

Yolk sac:  Present.

Embryo:  Present.

Cardiac Activity: Present.

Heart Rate: 113 bpm

CRL:   2.3  mm   5 w 6 d                  US EDC: 04/01/2015

Maternal uterus/adnexae: The right ovary is normal in size with
normal blood flow and contains a corpus luteal cyst. The left ovary
is normal in size with normal blood flow and contains a corpus
luteal cyst. There is no free fluid.
IMPRESSION: Early intrauterine pregnancy. There is now a yolk sac, fetal pole
and fetal cardiac activity. Estimated gestational age 5 weeks 6 days
for estimated date of delivery 04/01/2015. There is a small
subchorionic hemorrhage.

## 2016-10-15 ENCOUNTER — Encounter (HOSPITAL_COMMUNITY): Payer: Self-pay | Admitting: Emergency Medicine

## 2016-10-15 ENCOUNTER — Emergency Department (HOSPITAL_COMMUNITY)
Admission: EM | Admit: 2016-10-15 | Discharge: 2016-10-15 | Disposition: A | Discharge status: Home / Self Care | Payer: Medicaid Other | Attending: Emergency Medicine | Admitting: Emergency Medicine

## 2016-10-15 ENCOUNTER — Emergency Department (HOSPITAL_COMMUNITY): Payer: Medicaid Other

## 2016-10-15 DIAGNOSIS — Z79899 Other long term (current) drug therapy: Secondary | ICD-10-CM | POA: Diagnosis not present

## 2016-10-15 DIAGNOSIS — W1830XA Fall on same level, unspecified, initial encounter: Secondary | ICD-10-CM | POA: Insufficient documentation

## 2016-10-15 DIAGNOSIS — S93402A Sprain of unspecified ligament of left ankle, initial encounter: Secondary | ICD-10-CM | POA: Diagnosis not present

## 2016-10-15 DIAGNOSIS — S99912A Unspecified injury of left ankle, initial encounter: Secondary | ICD-10-CM | POA: Diagnosis present

## 2016-10-15 DIAGNOSIS — Y939 Activity, unspecified: Secondary | ICD-10-CM | POA: Insufficient documentation

## 2016-10-15 DIAGNOSIS — Y929 Unspecified place or not applicable: Secondary | ICD-10-CM | POA: Diagnosis not present

## 2016-10-15 DIAGNOSIS — Y999 Unspecified external cause status: Secondary | ICD-10-CM | POA: Insufficient documentation

## 2016-10-15 DIAGNOSIS — I10 Essential (primary) hypertension: Secondary | ICD-10-CM | POA: Diagnosis not present

## 2016-10-15 DIAGNOSIS — Z87891 Personal history of nicotine dependence: Secondary | ICD-10-CM | POA: Insufficient documentation

## 2016-10-15 MED ORDER — HYDROCODONE-ACETAMINOPHEN 5-325 MG PO TABS: 1.0000 | ORAL_TABLET | Freq: Four times a day (QID) | ORAL | 0 refills | Status: DC | PRN

## 2016-10-15 MED ORDER — NAPROXEN 375 MG PO TABS: 375.0000 mg | ORAL_TABLET | Freq: Two times a day (BID) | ORAL | 0 refills | Status: DC

## 2016-10-15 MED ORDER — HYDROCODONE-ACETAMINOPHEN 5-325 MG PO TABS
1.0000 | ORAL_TABLET | Freq: Once | ORAL | Status: AC
  Administered 2016-10-15: 1 via ORAL
  Filled 2016-10-15: qty 1

## 2016-10-15 MED ORDER — KETOROLAC TROMETHAMINE 60 MG/2ML IM SOLN
60.0000 mg | Freq: Once | INTRAMUSCULAR | Status: AC
  Administered 2016-10-15: 60 mg via INTRAMUSCULAR
  Filled 2016-10-15: qty 2

## 2016-10-15 NOTE — ED Notes (Signed)
Patient transported to X-ray 

## 2016-10-15 NOTE — ED Triage Notes (Signed)
Pt. Stated, I fell last night cause I had hills on and hurt my left ankle.

## 2016-10-15 NOTE — ED Notes (Signed)
Pt given ginger ale and warm blanket.

## 2016-10-15 NOTE — ED Provider Notes (Signed)
[facility] DEPT Provider Note   CSN: [id number] Arrival date & time: 10/15/16  1121  By signing my name below, I, Wendy Floyd, attest that this documentation has been prepared under the direction and in the presence of Blanchie Dessert, MD. Electronically Signed: Hansel Floyd, ED Scribe. 10/15/16. 12:21 PM.     History   Chief Complaint Chief Complaint  Patient presents with  . Ankle Pain  . Fall    HPI BRI Wendy Floyd is a 36 y.o. female who presents to the Emergency Department complaining of moderate left ankle pain and swelling s/p fall that occurred last night. Pt states she fell down 5 marble stairs wearing heels and felt her left ankle twist. She denies LOC, chest, abdominal or head injury. Pt states her pain is worsened with weight bearing, movement and ambulation. Pt reports no relief of pain with ice application in the ED. She takes no daily medications. NKDA. She denies knee pain, hip pain, numbness, additional injuries.   The history is provided by the patient. No language interpreter was used.    Past Medical History:  Diagnosis Date  . Anemia   . Endometriosis   . Fibroids   . Hypertension     Patient Active Problem List   Diagnosis Date Noted  . Status post cesarean section 03/25/2015  . Group B Streptococcus urinary tract infection complicating pregnancy in second trimester 11/28/2014  . Abdominal pain affecting pregnancy   . Mild dehydration   . Traumatic injury during pregnancy in second trimester   . Pregnancy 09/24/2014  . Smoker 09/24/2014    Past Surgical History:  Procedure Laterality Date  . CESAREAN SECTION     4 c/s prior   . CESAREAN SECTION N/A 03/25/2015   Procedure: CESAREAN SECTION;  Surgeon: Truett Mainland, DO;  Location: [facility];  Service: Obstetrics;  Laterality: N/A;  Requested 03/25/15 @ 11:00a    OB History    Gravida Para Term Preterm AB Living   5 5 5  0 0 5   SAB TAB Ectopic Multiple Live Births   0 0 0 0 5       Home  Medications    Prior to Admission medications   Medication Sig Start Date End Date Taking? Authorizing Provider  albuterol (PROVENTIL HFA;VENTOLIN HFA) 108 (90 BASE) MCG/ACT inhaler Inhale 2 puffs into the lungs every 6 (six) hours as needed for wheezing or shortness of breath. Patient not taking: Reported on 06/02/2015 09/28/14   Luvenia Redden, PA-C  ibuprofen (ADVIL,MOTRIN) 600 MG tablet Take 1 tablet (600 mg total) by mouth every 6 (six) hours as needed. 08/04/15   Lisa A Leftwich-Kirby, CNM  metroNIDAZOLE (FLAGYL) 500 MG tablet Take 1 tablet (500 mg total) by mouth 2 (two) times daily. 08/04/15   Kathie Dike Leftwich-Kirby, CNM  oxyCODONE-acetaminophen (PERCOCET/ROXICET) 5-325 MG per tablet Take 1-2 tablets by mouth every 4 (four) hours as needed (for pain scale 4-7). Patient not taking: Reported on 06/02/2015 03/28/15   Woodroe Mode, MD  Prenatal Vit-Fe Fumarate-FA (PRENATAL MULTIVITAMIN) TABS tablet Take 1 tablet by mouth daily at 12 noon.    Historical Provider, MD    Family History Family History  Problem Relation Age of Onset  . Diabetes Mother   . Hypertension Mother   . Diabetes Father   . Hearing loss Paternal Grandmother     Social History Social History  Substance Use Topics  . Smoking status: Former Smoker    Packs/day: 0.50    Quit  date: 07/30/2014  . Smokeless tobacco: Never Used  . Alcohol use Yes     Allergies   Patient has no known allergies.   Review of Systems Review of Systems  Cardiovascular: Negative for chest pain.  Gastrointestinal: Negative for abdominal pain.  Musculoskeletal: Positive for arthralgias (left ankle) and joint swelling (left ankle).  Neurological: Negative for syncope and numbness.     Physical Exam Updated Vital Signs BP 134/88 (BP Location: Left Arm)   Pulse 77   Temp 98.2 F (36.8 C) (Oral)   Resp 20   Ht 5\' 8"  (1.727 m)   Wt 220 lb (99.8 kg)   LMP 07/30/2016   SpO2 98%   BMI 33.45 kg/m   Physical Exam  Constitutional:  She appears well-developed and well-nourished.  HENT:  Head: Normocephalic.  Eyes: Conjunctivae are normal.  Cardiovascular: Normal rate.   Pulmonary/Chest: Effort normal. No respiratory distress.  Abdominal: She exhibits no distension.  Musculoskeletal:       Left ankle: She exhibits decreased range of motion, swelling, ecchymosis and deformity. She exhibits normal pulse. Tenderness. Lateral malleolus, medial malleolus and head of 5th metatarsal tenderness found. No proximal fibula tenderness found.  Neurological: She is alert.  Skin: Skin is warm and dry.  Psychiatric: She has a normal mood and affect. Her behavior is normal.  Nursing note and vitals reviewed.    ED Treatments / Results   DIAGNOSTIC STUDIES: Oxygen Saturation is 98% on RA, normal by my interpretation.    COORDINATION OF CARE: 12:18 PM Discussed treatment plan with pt at bedside which includes XR and pt agreed to plan.    Labs (all labs ordered are listed, but only abnormal results are displayed) Labs Reviewed - No data to display  EKG  EKG Interpretation None       Radiology Dg Ankle Complete Left  Result Date: 10/15/2016 CLINICAL DATA:  Left ankle pain and swelling following a fall last night, falling down 5 marble stairs. EXAM: LEFT ANKLE COMPLETE - 3+ VIEW COMPARISON:  None. FINDINGS: Diffuse soft tissue swelling, most pronounced laterally. Probable effusion. No fracture or dislocation seen. IMPRESSION: No fracture.  Probable effusion. Electronically Signed   By: Claudie Revering M.D.   On: 10/15/2016 13:24    Procedures Procedures (including critical care time)  Medications Ordered in ED Medications - No data to display   Initial Impression / Assessment and Plan / ED Course  I have reviewed the triage vital signs and the nursing notes.  Pertinent labs & imaging results that were available during my care of the patient were reviewed by me and considered in my medical decision making (see chart for  details).   recent presenting here today her mechanical fall last night with obvious signs of left ankle injury with swelling, ecchymosis, decreased range of motion. Concern for fracture versus severe sprain. No other injuries at this time. Patient given pain control and x-ray pending.  Patient X-Ray negative for obvious fracture or dislocation.  Pt advised to follow up with orthopedics. Patient given brace and crutches while in ED, conservative therapy recommended and discussed. Patient will be discharged home & is agreeable with above plan. Returns precautions discussed. Pt appears safe for discharge.   Final Clinical Impressions(s) / ED Diagnoses   Final diagnoses:  Sprain of left ankle, unspecified ligament, initial encounter    New Prescriptions New Prescriptions   HYDROCODONE-ACETAMINOPHEN (NORCO/VICODIN) 5-325 MG TABLET    Take 1 tablet by mouth every 6 (six) hours as needed  for severe pain.   NAPROXEN (NAPROSYN) 375 MG TABLET    Take 1 tablet (375 mg total) by mouth 2 (two) times daily.    I personally performed the services described in this documentation, which was scribed in my presence.  The recorded information has been reviewed and considered.    Blanchie Dessert, MD 10/15/16 1343

## 2016-11-07 ENCOUNTER — Emergency Department (HOSPITAL_COMMUNITY)
Admission: EM | Admit: 2016-11-07 | Discharge: 2016-11-07 | Disposition: A | Discharge status: Home / Self Care | Payer: Medicaid Other | Attending: Emergency Medicine | Admitting: Emergency Medicine

## 2016-11-07 ENCOUNTER — Encounter (HOSPITAL_COMMUNITY): Payer: Self-pay

## 2016-11-07 DIAGNOSIS — K2920 Alcoholic gastritis without bleeding: Secondary | ICD-10-CM | POA: Insufficient documentation

## 2016-11-07 DIAGNOSIS — R197 Diarrhea, unspecified: Secondary | ICD-10-CM

## 2016-11-07 DIAGNOSIS — F1010 Alcohol abuse, uncomplicated: Secondary | ICD-10-CM | POA: Insufficient documentation

## 2016-11-07 DIAGNOSIS — E876 Hypokalemia: Secondary | ICD-10-CM | POA: Insufficient documentation

## 2016-11-07 DIAGNOSIS — Z3A00 Weeks of gestation of pregnancy not specified: Secondary | ICD-10-CM | POA: Diagnosis not present

## 2016-11-07 DIAGNOSIS — O99310 Alcohol use complicating pregnancy, unspecified trimester: Secondary | ICD-10-CM | POA: Diagnosis not present

## 2016-11-07 DIAGNOSIS — R112 Nausea with vomiting, unspecified: Secondary | ICD-10-CM

## 2016-11-07 DIAGNOSIS — O219 Vomiting of pregnancy, unspecified: Secondary | ICD-10-CM | POA: Diagnosis present

## 2016-11-07 DIAGNOSIS — O218 Other vomiting complicating pregnancy: Secondary | ICD-10-CM | POA: Insufficient documentation

## 2016-11-07 DIAGNOSIS — I10 Essential (primary) hypertension: Secondary | ICD-10-CM | POA: Diagnosis not present

## 2016-11-07 DIAGNOSIS — Z87891 Personal history of nicotine dependence: Secondary | ICD-10-CM | POA: Insufficient documentation

## 2016-11-07 DIAGNOSIS — Z789 Other specified health status: Secondary | ICD-10-CM

## 2016-11-07 DIAGNOSIS — Z79899 Other long term (current) drug therapy: Secondary | ICD-10-CM | POA: Diagnosis not present

## 2016-11-07 DIAGNOSIS — Z7289 Other problems related to lifestyle: Secondary | ICD-10-CM

## 2016-11-07 DIAGNOSIS — Z3490 Encounter for supervision of normal pregnancy, unspecified, unspecified trimester: Secondary | ICD-10-CM

## 2016-11-07 DIAGNOSIS — A084 Viral intestinal infection, unspecified: Secondary | ICD-10-CM

## 2016-11-07 LAB — URINALYSIS, ROUTINE W REFLEX MICROSCOPIC
Bilirubin Urine: NEGATIVE
Glucose, UA: 50 mg/dL — AB
Ketones, ur: 80 mg/dL — AB
Leukocytes, UA: NEGATIVE
Nitrite: NEGATIVE
PROTEIN: 100 mg/dL — AB
SPECIFIC GRAVITY, URINE: 1.029 (ref 1.005–1.030)
pH: 5 (ref 5.0–8.0)

## 2016-11-07 LAB — CBC
HEMATOCRIT: 38.2 % (ref 36.0–46.0)
HEMOGLOBIN: 12.5 g/dL (ref 12.0–15.0)
MCH: 24.5 pg — ABNORMAL LOW (ref 26.0–34.0)
MCHC: 32.7 g/dL (ref 30.0–36.0)
MCV: 74.9 fL — ABNORMAL LOW (ref 78.0–100.0)
Platelets: 342 10*3/uL (ref 150–400)
RBC: 5.1 MIL/uL (ref 3.87–5.11)
RDW: 17.6 % — AB (ref 11.5–15.5)
WBC: 13 10*3/uL — ABNORMAL HIGH (ref 4.0–10.5)

## 2016-11-07 LAB — COMPREHENSIVE METABOLIC PANEL
ALBUMIN: 3.9 g/dL (ref 3.5–5.0)
ALT: 13 U/L — ABNORMAL LOW (ref 14–54)
ANION GAP: 13 (ref 5–15)
AST: 18 U/L (ref 15–41)
Alkaline Phosphatase: 55 U/L (ref 38–126)
BILIRUBIN TOTAL: 0.8 mg/dL (ref 0.3–1.2)
BUN: 5 mg/dL — ABNORMAL LOW (ref 6–20)
CHLORIDE: 103 mmol/L (ref 101–111)
CO2: 20 mmol/L — AB (ref 22–32)
Calcium: 9.9 mg/dL (ref 8.9–10.3)
Creatinine, Ser: 0.58 mg/dL (ref 0.44–1.00)
GFR calc Af Amer: >60 mL/min (ref >60)
GFR calc non Af Amer: >60 mL/min (ref >60)
GLUCOSE: 120 mg/dL — AB (ref 65–99)
POTASSIUM: 3.3 mmol/L — AB (ref 3.5–5.1)
SODIUM: 136 mmol/L (ref 135–145)
TOTAL PROTEIN: 7.6 g/dL (ref 6.5–8.1)

## 2016-11-07 LAB — PREGNANCY, URINE: PREG TEST UR: POSITIVE — AB

## 2016-11-07 LAB — LIPASE, BLOOD: Lipase: <10 U/L — ABNORMAL LOW (ref 11–51)

## 2016-11-07 MED ORDER — PRENATAL COMPLETE 14-0.4 MG PO TABS: 1.0000 | ORAL_TABLET | Freq: Every day | ORAL | 2 refills | Status: DC

## 2016-11-07 MED ORDER — GI COCKTAIL ~~LOC~~
30.0000 mL | Freq: Once | ORAL | Status: AC
  Administered 2016-11-07: 30 mL via ORAL
  Filled 2016-11-07: qty 30

## 2016-11-07 MED ORDER — FAMOTIDINE IN NACL 20-0.9 MG/50ML-% IV SOLN
20.0000 mg | Freq: Once | INTRAVENOUS | Status: AC
  Administered 2016-11-07: 20 mg via INTRAVENOUS
  Filled 2016-11-07: qty 50

## 2016-11-07 MED ORDER — ONDANSETRON 4 MG PO TBDP
4.0000 mg | ORAL_TABLET | Freq: Once | ORAL | Status: AC | PRN
  Administered 2016-11-07: 4 mg via ORAL

## 2016-11-07 MED ORDER — ONDANSETRON 4 MG PO TBDP: 4.0000 mg | ORAL_TABLET | Freq: Three times a day (TID) | ORAL | 0 refills | Status: DC | PRN

## 2016-11-07 MED ORDER — RANITIDINE HCL 150 MG PO TABS: 150.0000 mg | ORAL_TABLET | Freq: Two times a day (BID) | ORAL | 0 refills | Status: DC

## 2016-11-07 MED ORDER — POTASSIUM CHLORIDE CRYS ER 20 MEQ PO TBCR
40.0000 meq | EXTENDED_RELEASE_TABLET | Freq: Once | ORAL | Status: AC
Start: 2016-11-07 — End: 2016-11-07
  Administered 2016-11-07: 40 meq via ORAL
  Filled 2016-11-07: qty 2

## 2016-11-07 MED ORDER — ONDANSETRON 4 MG PO TBDP
ORAL_TABLET | ORAL | Status: AC
  Filled 2016-11-07: qty 1

## 2016-11-07 MED ORDER — ACETAMINOPHEN 160 MG/5ML PO SOLN
650.0000 mg | ORAL | Status: AC
  Administered 2016-11-07: 650 mg via ORAL
  Filled 2016-11-07: qty 20.3

## 2016-11-07 MED ORDER — SODIUM CHLORIDE 0.9 % IV BOLUS (SEPSIS)
1000.0000 mL | Freq: Once | INTRAVENOUS | Status: AC
  Administered 2016-11-07: 1000 mL via INTRAVENOUS

## 2016-11-07 MED ORDER — ONDANSETRON 4 MG PO TBDP
4.0000 mg | ORAL_TABLET | ORAL | Status: AC
  Administered 2016-11-07: 4 mg via ORAL
  Filled 2016-11-07: qty 1

## 2016-11-07 NOTE — ED Notes (Signed)
Pt ambulated to restroom to collect urine sample.

## 2016-11-07 NOTE — ED Triage Notes (Signed)
Per Pt, Pt reports having lower back pain x 1 week. Today, pt started to vomit, and is unable to keep anything down. Denies vaginal symptoms or urinary symptoms. Has had watery diarrhea for a week.

## 2016-11-07 NOTE — Discharge Instructions (Signed)
Your symptoms are likely from a viral gastroenteritis illness, or could be from gastritis or an ulcer due to your alcohol intake. You will need to take zantac as directed, and avoid spicy/fatty/acidic foods, avoid soda/coffee/tea/alcohol. STOP DRINKING ALCOHOL! Avoid laying down flat within 30 minutes of eating. May consider using over the counter tums/maalox as needed for additional relief. Use zofran as directed as needed for nausea. Use tylenol as needed for pain relief. DO NOT TAKE NSAIDs like ibuprofen/aleve/etc. Stay well hydrated with small sips of fluids throughout the day. For your diarrhea, follow a BRAT (banana-rice-applesauce-toast) diet as described below for the next 24-48 hours. The 'BRAT' diet is suggested, then progress to diet as tolerated as symptoms abate. Call your regular doctor if bloody stools, persistent diarrhea, vomiting, fever or abdominal pain.   You were found to be pregnant today. Start taking prenatal vitamins and follow up with your OBGYN or regular doctor for ongoing prenatal care.  Follow up with your regular doctor in 1 week for recheck of symptoms. Return to [facility] (their version of the ER) for changing or worsening of symptoms.

## 2016-11-07 NOTE — ED Provider Notes (Signed)
[facility] DEPT Provider Note   CSN: [id number] Arrival date & time: 11/07/16  1717     History   Chief Complaint Chief Complaint  Patient presents with  . Back Pain  . Emesis    HPI Wendy Floyd is a 36 y.o. female with a PMHx of fibroids, anemia, HTN, and endometriosis, with a PSHx of 5 c-sections, who presents to the ED with complaints of nausea and vomiting that began around 4 AM with subsequent diarrhea that began around 9 AM. Patient states that she woke up around 4 AM and began having nonstop emesis, tried to drink ginger ale but continued to throw up having approximately 40 episodes of nonbloody nonbilious emesis prior to arrival. She has not tried anything for her symptoms but she was given Zofran after arrival and states that this has significantly helped and she has been tolerating her ginger ale and sodas well without any further vomiting. She reports that she had 6 episodes of watery nonbloody diarrhea today as well. No known aggravating factors. She mentions that after vomiting she developed some mild constant nonradiating soreness across her lower abdomen, but she had not had any abdominal pain prior to vomiting. She also reports that last night she had a fever of 101.7 but woke up this morning with no fever and has not had any fevers since then. She reports that she drinks "a lot" of alcohol on occasion, last use was on Saturday night, she cannot quantify how much she drank that night. Reports that her LMP was in December or January, hx of irregular menses. +Sexually active with 1 female partner, occasionally unprotected.   She denies ongoing fevers, chills, CP, SOB, constipation, obstipation, melena, hematochezia, hematemesis, hematuria, dysuria, vaginal bleeding/discharge, myalgias, arthralgias, numbness, tingling, focal weakness, or any other complaints at this time. Denies recent travel, sick contacts, suspicious food intake, or frequent NSAID use.    The history is  provided by the patient and medical records. No language interpreter was used.  Emesis   This is a new problem. The current episode started 6 to 12 hours ago. The problem occurs continuously. The problem has been rapidly improving. The emesis has an appearance of stomach contents. The maximum temperature recorded prior to her arrival was 101 to 101.9 F. The fever has been present for less than 1 day. Associated symptoms include abdominal pain (soreness after vomiting), diarrhea and a fever (101.7 last night; none this morning). Pertinent negatives include no arthralgias, no chills, no cough, no myalgias and no URI.    Past Medical History:  Diagnosis Date  . Anemia   . Endometriosis   . Fibroids   . Hypertension     Patient Active Problem List   Diagnosis Date Noted  . Status post cesarean section 03/25/2015  . Group B Streptococcus urinary tract infection complicating pregnancy in second trimester 11/28/2014  . Abdominal pain affecting pregnancy   . Mild dehydration   . Traumatic injury during pregnancy in second trimester   . Pregnancy 09/24/2014  . Smoker 09/24/2014    Past Surgical History:  Procedure Laterality Date  . CESAREAN SECTION     4 c/s prior   . CESAREAN SECTION N/A 03/25/2015   Procedure: CESAREAN SECTION;  Surgeon: Truett Mainland, DO;  Location: [facility];  Service: Obstetrics;  Laterality: N/A;  Requested 03/25/15 @ 11:00a    OB History    Gravida Para Term Preterm AB Living   5 5 5  0 0 5   SAB  TAB Ectopic Multiple Live Births   0 0 0 0 5       Home Medications    Prior to Admission medications   Medication Sig Start Date End Date Taking? Authorizing Provider  albuterol (PROVENTIL HFA;VENTOLIN HFA) 108 (90 BASE) MCG/ACT inhaler Inhale 2 puffs into the lungs every 6 (six) hours as needed for wheezing or shortness of breath. Patient not taking: Reported on 06/02/2015 09/28/14   Luvenia Redden, PA-C  HYDROcodone-acetaminophen (NORCO/VICODIN) 5-325 MG tablet  Take 1 tablet by mouth every 6 (six) hours as needed for severe pain. 10/15/16   Blanchie Dessert, MD  ibuprofen (ADVIL,MOTRIN) 600 MG tablet Take 1 tablet (600 mg total) by mouth every 6 (six) hours as needed. 08/04/15   Lisa A Leftwich-Kirby, CNM  metroNIDAZOLE (FLAGYL) 500 MG tablet Take 1 tablet (500 mg total) by mouth 2 (two) times daily. 08/04/15   Lisa A Leftwich-Kirby, CNM  naproxen (NAPROSYN) 375 MG tablet Take 1 tablet (375 mg total) by mouth 2 (two) times daily. 10/15/16   Blanchie Dessert, MD  oxyCODONE-acetaminophen (PERCOCET/ROXICET) 5-325 MG per tablet Take 1-2 tablets by mouth every 4 (four) hours as needed (for pain scale 4-7). Patient not taking: Reported on 06/02/2015 03/28/15   Woodroe Mode, MD  Prenatal Vit-Fe Fumarate-FA (PRENATAL MULTIVITAMIN) TABS tablet Take 1 tablet by mouth daily at 12 noon.    Historical Provider, MD    Family History Family History  Problem Relation Age of Onset  . Diabetes Mother   . Hypertension Mother   . Diabetes Father   . Hearing loss Paternal Grandmother     Social History Social History  Substance Use Topics  . Smoking status: Former Smoker    Packs/day: 0.50    Quit date: 07/30/2014  . Smokeless tobacco: Never Used  . Alcohol use Yes     Allergies   Patient has no known allergies.   Review of Systems Review of Systems  Constitutional: Positive for fever (101.7 last night; none this morning). Negative for chills.  HENT: Negative for congestion and rhinorrhea.   Respiratory: Negative for cough and shortness of breath.   Cardiovascular: Negative for chest pain.  Gastrointestinal: Positive for abdominal pain (soreness after vomiting), diarrhea, nausea and vomiting. Negative for blood in stool and constipation.  Genitourinary: Negative for dysuria, hematuria, vaginal bleeding and vaginal discharge.  Musculoskeletal: Positive for back pain. Negative for arthralgias and myalgias.  Skin: Negative for color change.    Allergic/Immunologic: Negative for immunocompromised state.  Neurological: Negative for weakness and numbness.  Psychiatric/Behavioral: Negative for confusion.   10 Systems reviewed and are negative for acute change except as noted in the HPI.   Physical Exam Updated Vital Signs BP 139/86 (BP Location: Right Arm)   Pulse 82   Temp 97.9 F (36.6 C) (Oral)   Resp 16   Ht 5\' 8"  (1.727 m)   Wt 95.3 kg   SpO2 100%   BMI 31.93 kg/m   Physical Exam  Constitutional: She is oriented to person, place, and time. Vital signs are normal. She appears well-developed and well-nourished.  Non-toxic appearance. No distress.  Afebrile, nontoxic, NAD  HENT:  Head: Normocephalic and atraumatic.  Mouth/Throat: Oropharynx is clear and moist. Mucous membranes are dry.  Mildly dry lips  Eyes: Conjunctivae and EOM are normal. Right eye exhibits no discharge. Left eye exhibits no discharge.  Neck: Normal range of motion. Neck supple.  Cardiovascular: Normal rate, regular rhythm, normal heart sounds and intact distal pulses.  Exam reveals no gallop and no friction rub.   No murmur heard. Pulmonary/Chest: Effort normal and breath sounds normal. No respiratory distress. She has no decreased breath sounds. She has no wheezes. She has no rhonchi. She has no rales.  Abdominal: Soft. Normal appearance and bowel sounds are normal. She exhibits no distension. There is tenderness in the epigastric area. There is no rigidity, no rebound, no guarding, no CVA tenderness, no tenderness at McBurney's point and negative Murphy's sign.  Soft, nondistended, +BS throughout, with mild epigastric TTP, no r/g/r, no lower abd TTP, neg murphy's, neg mcburney's, no CVA TTP   Musculoskeletal: Normal range of motion.  Neurological: She is alert and oriented to person, place, and time. She has normal strength. No sensory deficit.  Skin: Skin is warm, dry and intact. No rash noted.  Psychiatric: She has a normal mood and affect.   Nursing note and vitals reviewed.    ED Treatments / Results  Labs (all labs ordered are listed, but only abnormal results are displayed) Labs Reviewed  LIPASE, BLOOD - Abnormal; Notable for the following:       Result Value   Lipase <10 (*)    All other components within normal limits  COMPREHENSIVE METABOLIC PANEL - Abnormal; Notable for the following:    Potassium 3.3 (*)    CO2 20 (*)    Glucose, Bld 120 (*)    BUN 5 (*)    ALT 13 (*)    All other components within normal limits  CBC - Abnormal; Notable for the following:    WBC 13.0 (*)    MCV 74.9 (*)    MCH 24.5 (*)    RDW 17.6 (*)    All other components within normal limits  URINALYSIS, ROUTINE W REFLEX MICROSCOPIC - Abnormal; Notable for the following:    APPearance HAZY (*)    Glucose, UA 50 (*)    Hgb urine dipstick MODERATE (*)    Ketones, ur 80 (*)    Protein, ur 100 (*)    Bacteria, UA RARE (*)    Squamous Epithelial / LPF 6-30 (*)    All other components within normal limits  PREGNANCY, URINE - Abnormal; Notable for the following:    Preg Test, Ur POSITIVE (*)    All other components within normal limits  URINE CULTURE    EKG  EKG Interpretation None       Radiology No results found.  Procedures Procedures (including critical care time)  Medications Ordered in ED Medications  ondansetron (ZOFRAN-ODT) 4 MG disintegrating tablet (not administered)  famotidine (PEPCID) IVPB 20 mg premix (20 mg Intravenous New Bag/Given 11/07/16 1945)  ondansetron (ZOFRAN-ODT) disintegrating tablet 4 mg (4 mg Oral Given 11/07/16 1724)  potassium chloride SA (K-DUR,KLOR-CON) CR tablet 40 mEq (40 mEq Oral Given 11/07/16 1940)  sodium chloride 0.9 % bolus 1,000 mL (1,000 mLs Intravenous New Bag/Given 11/07/16 1943)  gi cocktail (Maalox,Lidocaine,Donnatal) (30 mLs Oral Given 11/07/16 1938)     Initial Impression / Assessment and Plan / ED Course  I have reviewed the triage vital signs and the nursing  notes.  Pertinent labs & imaging results that were available during my care of the patient were reviewed by me and considered in my medical decision making (see chart for details).     36 y.o. female here with n/v/d that began this morning at 4am, had a fever last night but none today. Heavy EtOH consumption Saturday night (3 days ago). On exam, appears  comfortable in NAD, afebrile and nontoxic, mildly dry lips, abd with very mild epigastric TTP but nonperitoneal exam, no flank tenderness, neg murphy's sign. Pt given zofran in triage and that helped significantly, tolerating PO well now. CBC with mildly elevated WBC at 13.0 but appears hemoconcentrated so doubt this is clinically significant. CMP with mildly low K 3.3, will replete orally now. Lipase WNL. U/A with some RBCs but appears grossly contaminated; no evidence of UTI; some protein and ketones, likely from dehydration. Added Upreg as well. Will give fluids, pepcid, GI cocktail, and reassess shortly. Doubt need for imaging at this time. Doubt need for pelvic exam, no lower abd tenderness and no vaginal complaints. Will recheck shortly.   7:57 PM Upreg incidentally positive; no lower abd tenderness on exam, doubt need for emergent evaluation of this finding, will start on prenatals and have her f/up with her PCP/OBGYN for ongoing prenatal care. Given that she's pregnant, will add-on UCx, but doubt need for empiric tx of UTI considering no evidence of UTI on urine sample. Pt feeling some better after GI cocktail; fluids just started so will let these finish before discharging her; nursing staff aware of this. Her diarrhea/n/v symptoms are still likely due to viral gastroenteritis vs gastritis from EtOH use, possibly compounded by the finding of her pregnancy. Pt tolerating PO well here. Advised cessation of EtOH use. Will send home with zantac/zofran and prenatals, advised diet/lifestyle modifications for gastritis and for pregnancy status. BRAT diet  advised. Tylenol/tums/maalox PRN additional relief advised. Avoidance of NSAIDs advised. Discussed f/up with PCP/OBGYN in 1wk for recheck of symptoms and ongoing management of her prengnacy. I explained the diagnosis and have given explicit precautions to return to the ER including for any other new or worsening symptoms. The patient understands and accepts the medical plan as it's been dictated and I have answered their questions. Discharge instructions concerning home care and prescriptions have been given. The patient is STABLE and is discharged to home in good condition.    Final Clinical Impressions(s) / ED Diagnoses   Final diagnoses:  Nausea vomiting and diarrhea  Viral gastroenteritis  Alcohol use  Acute alcoholic gastritis, presence of bleeding unspecified  Hypokalemia  Pregnancy, unspecified gestational age    New Prescriptions New Prescriptions   ONDANSETRON (ZOFRAN ODT) 4 MG DISINTEGRATING TABLET    Take 1 tablet (4 mg total) by mouth every 8 (eight) hours as needed for nausea or vomiting.   PRENATAL VIT-FE FUMARATE-FA (PRENATAL COMPLETE) 14-0.4 MG TABS    Take 1 tablet by mouth daily after breakfast.   RANITIDINE (ZANTAC) 150 MG TABLET    Take 1 tablet (150 mg total) by mouth 2 (two) times daily.       [address] PA-C 11/07/16 2000    Merrily Pew, MD 11/07/16 2141

## 2016-11-09 LAB — URINE CULTURE

## 2017-01-02 ENCOUNTER — Encounter: Payer: Self-pay | Admitting: Certified Nurse Midwife

## 2017-01-02 ENCOUNTER — Ambulatory Visit (INDEPENDENT_AMBULATORY_CARE_PROVIDER_SITE_OTHER): Payer: Medicaid Other | Admitting: Certified Nurse Midwife

## 2017-01-02 ENCOUNTER — Other Ambulatory Visit (HOSPITAL_COMMUNITY)
Admission: RE | Admit: 2017-01-02 | Discharge: 2017-01-02 | Disposition: A | Discharge status: Home / Self Care | Payer: Medicaid Other | Source: Ambulatory Visit | Attending: Certified Nurse Midwife | Admitting: Certified Nurse Midwife

## 2017-01-02 VITALS — BP 103/69 | HR 80 | Wt 202.0 lb

## 2017-01-02 DIAGNOSIS — O0990 Supervision of high risk pregnancy, unspecified, unspecified trimester: Secondary | ICD-10-CM

## 2017-01-02 DIAGNOSIS — Z3A00 Weeks of gestation of pregnancy not specified: Secondary | ICD-10-CM | POA: Diagnosis not present

## 2017-01-02 DIAGNOSIS — O10912 Unspecified pre-existing hypertension complicating pregnancy, second trimester: Secondary | ICD-10-CM

## 2017-01-02 DIAGNOSIS — O0932 Supervision of pregnancy with insufficient antenatal care, second trimester: Secondary | ICD-10-CM

## 2017-01-02 DIAGNOSIS — O0992 Supervision of high risk pregnancy, unspecified, second trimester: Secondary | ICD-10-CM

## 2017-01-02 DIAGNOSIS — O10919 Unspecified pre-existing hypertension complicating pregnancy, unspecified trimester: Secondary | ICD-10-CM

## 2017-01-02 DIAGNOSIS — O09529 Supervision of elderly multigravida, unspecified trimester: Secondary | ICD-10-CM | POA: Insufficient documentation

## 2017-01-02 DIAGNOSIS — Z98891 History of uterine scar from previous surgery: Secondary | ICD-10-CM

## 2017-01-02 MED ORDER — ASPIRIN 81 MG PO CHEW
81.0000 mg | CHEWABLE_TABLET | Freq: Every day | ORAL | 12 refills | Status: DC
Start: 2017-01-02 — End: 2017-05-02

## 2017-01-02 NOTE — Progress Notes (Signed)
Subjective:    Wendy Floyd is being seen today for her first obstetrical visit.  This is not a planned pregnancy. She is at 92w2dgestation. Her obstetrical history is significant for advanced maternal age, smoker and chronic HTN, previous C-section. Relationship with FOB: significant other, living together. Patient does intend to breast feed. Pregnancy history fully reviewed.  Patient does work from home.   The information documented in the HPI was reviewed and verified.  Menstrual History: OB History    Gravida Para Term Preterm AB Living   6 5 5  0 0 5   SAB TAB Ectopic Multiple Live Births   0 0 0 0 5       Patient's last menstrual period was 07/30/2016.    Past Medical History:  Diagnosis Date  . Anemia   . Endometriosis   . Fibroids   . Hypertension     Past Surgical History:  Procedure Laterality Date  . CESAREAN SECTION     4 c/s prior   . CESAREAN SECTION N/A 03/25/2015   Procedure: CESAREAN SECTION;  Surgeon: JTruett Mainland DO;  [facility];  Service: Obstetrics;  Laterality: N/A;  Requested 03/25/15 @ 11:00a     (Not in a hospital admission) No Known Allergies  Social History  Substance Use Topics  . Smoking status: Former Smoker    Packs/day: 0.50    Quit date: 07/30/2014  . Smokeless tobacco: Never Used  . Alcohol use No    Family History  Problem Relation Age of Onset  . Diabetes Mother   . Hypertension Mother   . Diabetes Father   . Hearing loss Paternal Grandmother      Review of Systems Constitutional: negative for weight loss Gastrointestinal: negative for vomiting Genitourinary:negative for genital lesions and vaginal discharge and dysuria Musculoskeletal:negative for back pain Behavioral/Psych: negative for abusive relationship, depression, illegal drug usage and tobacco use    Objective:    BP 103/69   Pulse 80   Wt 202 lb (91.6 kg)   LMP 07/30/2016   BMI 30.71 kg/m  General Appearance:    Alert, cooperative, no distress,  appears stated age  Head:    Normocephalic, without obvious abnormality, atraumatic  Eyes:    PERRL, conjunctiva/corneas clear, EOM's intact, fundi    benign, both eyes  Ears:    Normal TM's and external ear canals, both ears  Nose:   Nares normal, septum midline, mucosa normal, no drainage    or sinus tenderness  Throat:   Lips, mucosa, and tongue normal; teeth and gums normal  Neck:   Supple, symmetrical, trachea midline, no adenopathy;    thyroid:  no enlargement/tenderness/nodules; no carotid   bruit or JVD  Back:     Symmetric, no curvature, ROM normal, no CVA tenderness  Lungs:     Clear to auscultation bilaterally, respirations unlabored  Chest Wall:    No tenderness or deformity   Heart:    Regular rate and rhythm, S1 and S2 normal, no murmur, rub   or gallop  Breast Exam:    No tenderness, masses, or nipple abnormality  Abdomen:     Soft, non-tender, bowel sounds active all four quadrants,    no masses, no organomegaly  Genitalia:    Normal female without lesion, discharge or tenderness  Extremities:   Extremities normal, atraumatic, no cyanosis or edema  Pulses:   2+ and symmetric all extremities  Skin:   Skin color, texture, turgor normal, no rashes or lesions  Lymph nodes:   Cervical, supraclavicular, and axillary nodes normal  Neurologic:   CNII-XII intact, normal strength, sensation and reflexes    throughout            Cervix:  Long, thick, closed and posterior.  FHR: 130 by doppler.  FH:     Lab Review Urine pregnancy test Labs reviewed yes Radiologic studies reviewed no  Assessment & Plan    Pregnancy at 10w2dweeks   1. Supervision of high risk pregnancy, antepartum      - [facility] OB DET[facility] +14 WK; Future - Varicella zoster antibody, IgG - Culture, OB Urine - MaterniT21 PLUS Core+SCA - Hemoglobin A1c - Obstetric Panel, Including HIV - Cystic Fibrosis Mutation 97 - Hemoglobinopathy evaluation - TSH Pregnancy  2. Late prenatal care affecting pregnancy in  second trimester     @[redacted]w[redacted]d  by dates - [facility] OB DETAIL +14 WK; Future  3. Chronic hypertension during pregnancy, antepartum     Normotensive today - aspirin 81 MG chewable tablet; Chew 1 tablet (81 mg total) by mouth daily.  Dispense: 30 tablet; Refill: 12 - Protein / creatinine ratio, urine - Comp Met (CMET) - [facility] OB DETAIL +14 WK; Future  4. Status post cesarean section      X5 previous C-sections. Repeat C-section   Prenatal vitamins.  Counseling provided regarding continued use of seat belts, cessation of alcohol consumption, smoking or use of illicit drugs; infection precautions i.e., influenza/TDAP immunizations, toxoplasmosis,CMV, parvovirus, listeria and varicella; workplace safety, exercise during pregnancy; routine dental care, safe medications, sexual activity, hot tubs, saunas, pools, travel, caffeine use, fish and methlymercury, potential toxins, hair treatments, varicose veins Weight gain recommendations per IOM guidelines reviewed: underweight/BMI< 18.5--> gain 28 - 40 lbs; normal weight/BMI 18.5 - 24.9--> gain 25 - 35 lbs; overweight/BMI 25 - 29.9--> gain 15 - 25 lbs; obese/BMI >30->gain  11 - 20 lbs Problem list reviewed and updated. FIRST/CF mutation testing/NIPT/QUAD SCREEN/fragile X/Ashkenazi Jewish population testing/Spinal muscular atrophy discussed: ordered. Role of ultrasound in pregnancy discussed; fetal survey: ordered. Amniocentesis discussed: not indicated.  Meds ordered this encounter  Medications  . aspirin 81 MG chewable tablet    Sig: Chew 1 tablet (81 mg total) by mouth daily.    Dispense:  30 tablet    Refill:  12   Orders Placed This Encounter  Procedures  . Culture, OB Urine  . [facility] OB DETAIL +14 WK    Standing Status:   Future    Standing Expiration Date:   03/04/2018    Order Specific Question:   Reason for Exam (SYMPTOM  OR DIAGNOSIS REQUIRED)    Answer:   dating, anatomy scan, CHTN    Order Specific Question:   Preferred imaging  location?    Answer:   [facility]  . Protein / creatinine ratio, urine  . Comp Met (CMET)  . Varicella zoster antibody, IgG  . MaterniT21 PLUS Core+SCA    Order Specific Question:   Is the patient insulin dependent?    Answer:   No    Order Specific Question:   Please enter gestational age. This should be expressed as weeks AND days, i.e. 16w 6d. Enter weeks here. Enter days in next question.    Answer:   284   Order Specific Question:   Please enter gestational age. This should be expressed as weeks AND days, i.e. 16w 6d. Enter days here. Enter weeks in previous question.    Answer:   2  Order Specific Question:   How was gestational age calculated?    Answer:   LMP    Order Specific Question:   Please give the date of LMP OR Ultrasound OR Estimated date of delivery.    Answer:   05/06/2017    Order Specific Question:   Number of Fetuses (Type of Pregnancy):    Answer:   1    Order Specific Question:   Indications for performing the test? (please choose all that apply):    Answer:   Routine screening    Order Specific Question:   Other Indications? (Y=Yes, N=No)    Answer:   Y    Order Specific Question:   Please specify other indications, if any:    Answer:   CHTN, Late prenatal care    Order Specific Question:   If this is a repeat specimen, please indicate the reason:    Answer:   Not indicated    Order Specific Question:   Please specify the patient's race: (C=White/Caucasion, B=Black, I=Native American, A=Asian, H=Hispanic, O=Other, U=Unknown)    Answer:   B    Order Specific Question:   Donor Egg - indicate if the egg was obtained from in vitro fertilization.    Answer:   N    Order Specific Question:   Age of Egg Donor.    Answer:   77    Order Specific Question:   Prior Down Syndrome/ONTD screening during current pregnancy.    Answer:   N    Order Specific Question:   Prior First Trimester Testing    Answer:   N    Order Specific Question:   Prior Second Trimester  Testing    Answer:   N    Order Specific Question:   Family History of Neural Tube Defects    Answer:   N    Order Specific Question:   Prior Pregnancy with Down Syndrome    Answer:   N    Order Specific Question:   Please give the patient's weight (in pounds)    Answer:   210  . Hemoglobin A1c  . Obstetric Panel, Including HIV  . Cystic Fibrosis Mutation 97  . Hemoglobinopathy evaluation  . TSH Pregnancy    Follow up in 2 weeks. 50% of 30 min visit spent on counseling and coordination of care.

## 2017-01-02 NOTE — Progress Notes (Signed)
Patient is in the office for initial ob visit, pt reports feeling fetal movement, denies pain. Pt states she is unsure about LMP.

## 2017-01-03 LAB — PROTEIN / CREATININE RATIO, URINE
Creatinine, Urine: 290.7 mg/dL
PROTEIN UR: 33.4 mg/dL
Protein/Creat Ratio: 115 mg/g creat (ref 0–200)

## 2017-01-04 LAB — CERVICOVAGINAL ANCILLARY ONLY
Bacterial vaginitis: NEGATIVE
CANDIDA VAGINITIS: POSITIVE — AB
Chlamydia: NEGATIVE
Neisseria Gonorrhea: NEGATIVE
TRICH (WINDOWPATH): NEGATIVE

## 2017-01-04 LAB — URINE CULTURE, OB REFLEX

## 2017-01-04 LAB — CULTURE, OB URINE

## 2017-01-05 ENCOUNTER — Other Ambulatory Visit: Payer: Self-pay | Admitting: Certified Nurse Midwife

## 2017-01-05 DIAGNOSIS — O99012 Anemia complicating pregnancy, second trimester: Secondary | ICD-10-CM | POA: Insufficient documentation

## 2017-01-05 DIAGNOSIS — B3731 Acute candidiasis of vulva and vagina: Secondary | ICD-10-CM

## 2017-01-05 DIAGNOSIS — E876 Hypokalemia: Secondary | ICD-10-CM | POA: Insufficient documentation

## 2017-01-05 DIAGNOSIS — B373 Candidiasis of vulva and vagina: Secondary | ICD-10-CM

## 2017-01-05 LAB — COMPREHENSIVE METABOLIC PANEL
ALBUMIN: 3.7 g/dL (ref 3.5–5.5)
ALK PHOS: 41 IU/L (ref 39–117)
ALT: 9 IU/L (ref 0–32)
AST: 12 IU/L (ref 0–40)
Albumin/Globulin Ratio: 1.5 (ref 1.2–2.2)
BILIRUBIN TOTAL: 0.2 mg/dL (ref 0.0–1.2)
BUN / CREAT RATIO: 8 — AB (ref 9–23)
BUN: 4 mg/dL — AB (ref 6–20)
CHLORIDE: 102 mmol/L (ref 96–106)
CO2: 22 mmol/L (ref 18–29)
Calcium: 8.8 mg/dL (ref 8.7–10.2)
Creatinine, Ser: 0.52 mg/dL — ABNORMAL LOW (ref 0.57–1.00)
GFR calc Af Amer: 141 mL/min/{1.73_m2} (ref >59)
GFR calc non Af Amer: 122 mL/min/{1.73_m2} (ref >59)
GLOBULIN, TOTAL: 2.4 g/dL (ref 1.5–4.5)
GLUCOSE: 85 mg/dL (ref 65–99)
Potassium: 3.4 mmol/L — ABNORMAL LOW (ref 3.5–5.2)
SODIUM: 137 mmol/L (ref 134–144)
Total Protein: 6.1 g/dL (ref 6.0–8.5)

## 2017-01-05 LAB — HEMOGLOBINOPATHY EVALUATION
HEMOGLOBIN F QUANTITATION: 0 % (ref 0.0–2.0)
HGB C: 0 %
HGB S: 0 %
HGB VARIANT: 0 %
Hemoglobin A2 Quantitation: 2.1 % (ref 1.8–3.2)
Hgb A: 97.9 % (ref 96.4–98.8)

## 2017-01-05 LAB — OBSTETRIC PANEL, INCLUDING HIV
ANTIBODY SCREEN: NEGATIVE
BASOS: 0 %
Basophils Absolute: 0 10*3/uL (ref 0.0–0.2)
EOS (ABSOLUTE): 0.2 10*3/uL (ref 0.0–0.4)
Eos: 2 %
HEMATOCRIT: 33.1 % — AB (ref 34.0–46.6)
HIV Screen 4th Generation wRfx: NONREACTIVE
Hemoglobin: 10.6 g/dL — ABNORMAL LOW (ref 11.1–15.9)
Hepatitis B Surface Ag: NEGATIVE
IMMATURE GRANS (ABS): 0 10*3/uL (ref 0.0–0.1)
Immature Granulocytes: 0 %
LYMPHS ABS: 2.3 10*3/uL (ref 0.7–3.1)
LYMPHS: 23 %
MCH: 26.6 pg (ref 26.6–33.0)
MCHC: 32 g/dL (ref 31.5–35.7)
MCV: 83 fL (ref 79–97)
MONOS ABS: 0.5 10*3/uL (ref 0.1–0.9)
Monocytes: 5 %
NEUTROS PCT: 70 %
Neutrophils Absolute: 7.1 10*3/uL — ABNORMAL HIGH (ref 1.4–7.0)
Platelets: 267 10*3/uL (ref 150–379)
RBC: 3.99 x10E6/uL (ref 3.77–5.28)
RDW: 17.7 % — AB (ref 12.3–15.4)
RPR Ser Ql: NONREACTIVE
Rh Factor: POSITIVE
Rubella Antibodies, IGG: 1.04 index (ref >0.99)
WBC: 10.1 10*3/uL (ref 3.4–10.8)

## 2017-01-05 LAB — CYTOLOGY - PAP
DIAGNOSIS: NEGATIVE
HPV: NOT DETECTED

## 2017-01-05 LAB — HEMOGLOBIN A1C
Est. average glucose Bld gHb Est-mCnc: 103 mg/dL
HEMOGLOBIN A1C: 5.2 % (ref 4.8–5.6)

## 2017-01-05 LAB — VARICELLA ZOSTER ANTIBODY, IGG

## 2017-01-05 LAB — TSH PREGNANCY: TSH PREGNANCY: 0.786 u[IU]/mL (ref 0.450–4.500)

## 2017-01-05 MED ORDER — PRENATE PIXIE 10-0.6-0.4-200 MG PO CAPS: 1.0000 | ORAL_CAPSULE | Freq: Every day | ORAL | 12 refills | Status: DC

## 2017-01-05 MED ORDER — TERCONAZOLE 0.8 % VA CREA: 1.0000 | TOPICAL_CREAM | Freq: Every day | VAGINAL | 0 refills | Status: DC

## 2017-01-05 MED ORDER — CITRANATAL BLOOM 90-1 MG PO TABS: 1.0000 | ORAL_TABLET | Freq: Every day | ORAL | 12 refills | Status: DC

## 2017-01-05 MED ORDER — FLUCONAZOLE 150 MG PO TABS: 150.0000 mg | ORAL_TABLET | Freq: Once | ORAL | 0 refills | Status: AC

## 2017-01-05 MED ORDER — POTASSIUM CHLORIDE ER 10 MEQ PO TBCR: 10.0000 meq | EXTENDED_RELEASE_TABLET | Freq: Every day | ORAL | 0 refills | Status: DC

## 2017-01-08 ENCOUNTER — Other Ambulatory Visit: Payer: Self-pay | Admitting: Certified Nurse Midwife

## 2017-01-08 ENCOUNTER — Encounter: Payer: Self-pay | Admitting: *Deleted

## 2017-01-08 DIAGNOSIS — O0990 Supervision of high risk pregnancy, unspecified, unspecified trimester: Secondary | ICD-10-CM

## 2017-01-08 LAB — MATERNIT21 PLUS CORE+SCA
CHROMOSOME 18: NEGATIVE
CHROMOSOME 21: NEGATIVE
Chromosome 13: NEGATIVE
Y Chromosome: NOT DETECTED

## 2017-01-09 ENCOUNTER — Other Ambulatory Visit: Payer: Self-pay | Admitting: Certified Nurse Midwife

## 2017-01-09 ENCOUNTER — Ambulatory Visit (HOSPITAL_COMMUNITY)
Admission: RE | Admit: 2017-01-09 | Discharge: 2017-01-09 | Disposition: A | Discharge status: Home / Self Care | Payer: Medicaid Other | Source: Ambulatory Visit | Attending: Certified Nurse Midwife | Admitting: Certified Nurse Midwife

## 2017-01-09 DIAGNOSIS — O365920 Maternal care for other known or suspected poor fetal growth, second trimester, not applicable or unspecified: Secondary | ICD-10-CM | POA: Diagnosis not present

## 2017-01-09 DIAGNOSIS — Z3A23 23 weeks gestation of pregnancy: Secondary | ICD-10-CM | POA: Diagnosis not present

## 2017-01-09 DIAGNOSIS — O09522 Supervision of elderly multigravida, second trimester: Secondary | ICD-10-CM

## 2017-01-09 DIAGNOSIS — O10912 Unspecified pre-existing hypertension complicating pregnancy, second trimester: Secondary | ICD-10-CM | POA: Diagnosis not present

## 2017-01-09 DIAGNOSIS — O0990 Supervision of high risk pregnancy, unspecified, unspecified trimester: Secondary | ICD-10-CM

## 2017-01-09 DIAGNOSIS — O10919 Unspecified pre-existing hypertension complicating pregnancy, unspecified trimester: Secondary | ICD-10-CM

## 2017-01-09 DIAGNOSIS — O0992 Supervision of high risk pregnancy, unspecified, second trimester: Secondary | ICD-10-CM | POA: Diagnosis present

## 2017-01-09 DIAGNOSIS — O0932 Supervision of pregnancy with insufficient antenatal care, second trimester: Secondary | ICD-10-CM

## 2017-01-09 LAB — CYSTIC FIBROSIS MUTATION 97: GENE DIS ANAL CARRIER INTERP BLD/T-IMP: NOT DETECTED

## 2017-01-10 ENCOUNTER — Other Ambulatory Visit: Payer: Self-pay | Admitting: Certified Nurse Midwife

## 2017-01-10 ENCOUNTER — Other Ambulatory Visit (HOSPITAL_COMMUNITY): Payer: Self-pay | Admitting: *Deleted

## 2017-01-10 DIAGNOSIS — O09522 Supervision of elderly multigravida, second trimester: Secondary | ICD-10-CM

## 2017-01-10 DIAGNOSIS — E876 Hypokalemia: Secondary | ICD-10-CM

## 2017-01-10 DIAGNOSIS — O0932 Supervision of pregnancy with insufficient antenatal care, second trimester: Secondary | ICD-10-CM

## 2017-01-10 DIAGNOSIS — O10919 Unspecified pre-existing hypertension complicating pregnancy, unspecified trimester: Secondary | ICD-10-CM

## 2017-01-10 DIAGNOSIS — O365920 Maternal care for other known or suspected poor fetal growth, second trimester, not applicable or unspecified: Secondary | ICD-10-CM

## 2017-01-10 DIAGNOSIS — O0990 Supervision of high risk pregnancy, unspecified, unspecified trimester: Secondary | ICD-10-CM

## 2017-01-10 DIAGNOSIS — F17200 Nicotine dependence, unspecified, uncomplicated: Secondary | ICD-10-CM

## 2017-01-16 ENCOUNTER — Encounter: Payer: Medicaid Other | Admitting: Obstetrics & Gynecology

## 2017-01-17 ENCOUNTER — Encounter: Payer: Medicaid Other | Admitting: Obstetrics

## 2017-01-18 ENCOUNTER — Encounter: Payer: Medicaid Other | Admitting: Obstetrics

## 2017-01-22 ENCOUNTER — Encounter (HOSPITAL_COMMUNITY): Payer: Self-pay | Admitting: *Deleted

## 2017-01-22 ENCOUNTER — Inpatient Hospital Stay (HOSPITAL_COMMUNITY)
Admission: AD | Admit: 2017-01-22 | Discharge: 2017-01-23 | Disposition: A | Discharge status: Home / Self Care | Payer: Medicaid Other | Source: Ambulatory Visit | Attending: Obstetrics and Gynecology | Admitting: Obstetrics and Gynecology

## 2017-01-22 DIAGNOSIS — Z3A25 25 weeks gestation of pregnancy: Secondary | ICD-10-CM | POA: Insufficient documentation

## 2017-01-22 DIAGNOSIS — O26899 Other specified pregnancy related conditions, unspecified trimester: Secondary | ICD-10-CM

## 2017-01-22 DIAGNOSIS — O34219 Maternal care for unspecified type scar from previous cesarean delivery: Secondary | ICD-10-CM | POA: Diagnosis not present

## 2017-01-22 DIAGNOSIS — Z87891 Personal history of nicotine dependence: Secondary | ICD-10-CM | POA: Insufficient documentation

## 2017-01-22 DIAGNOSIS — O26892 Other specified pregnancy related conditions, second trimester: Secondary | ICD-10-CM | POA: Diagnosis not present

## 2017-01-22 DIAGNOSIS — Z79899 Other long term (current) drug therapy: Secondary | ICD-10-CM | POA: Diagnosis not present

## 2017-01-22 DIAGNOSIS — Z8249 Family history of ischemic heart disease and other diseases of the circulatory system: Secondary | ICD-10-CM | POA: Insufficient documentation

## 2017-01-22 DIAGNOSIS — Z833 Family history of diabetes mellitus: Secondary | ICD-10-CM | POA: Insufficient documentation

## 2017-01-22 DIAGNOSIS — O162 Unspecified maternal hypertension, second trimester: Secondary | ICD-10-CM | POA: Insufficient documentation

## 2017-01-22 DIAGNOSIS — Z7982 Long term (current) use of aspirin: Secondary | ICD-10-CM | POA: Insufficient documentation

## 2017-01-22 DIAGNOSIS — O9989 Other specified diseases and conditions complicating pregnancy, childbirth and the puerperium: Secondary | ICD-10-CM | POA: Diagnosis not present

## 2017-01-22 DIAGNOSIS — R1031 Right lower quadrant pain: Secondary | ICD-10-CM | POA: Diagnosis present

## 2017-01-22 NOTE — MAU Note (Signed)
PT  SAYS SHE HAS RIGHT LOWER  ABD  PAIN    THAT STARTED  YESTERDAY  AFTERNOON.    SAYS URINE IS ORANGE.  NO MED  FOR  PAIN  FEELS  BABY MOVE.    [place]- WITH [facility].    LAST SEX- 07-2016.

## 2017-01-23 ENCOUNTER — Telehealth: Payer: Self-pay | Admitting: Obstetrics and Gynecology

## 2017-01-23 ENCOUNTER — Other Ambulatory Visit: Payer: Self-pay | Admitting: Obstetrics and Gynecology

## 2017-01-23 DIAGNOSIS — O9989 Other specified diseases and conditions complicating pregnancy, childbirth and the puerperium: Secondary | ICD-10-CM | POA: Diagnosis not present

## 2017-01-23 DIAGNOSIS — R1031 Right lower quadrant pain: Secondary | ICD-10-CM

## 2017-01-23 LAB — URINALYSIS, ROUTINE W REFLEX MICROSCOPIC
GLUCOSE, UA: NEGATIVE mg/dL
KETONES UR: 5 mg/dL — AB
NITRITE: NEGATIVE
PROTEIN: 30 mg/dL — AB
Specific Gravity, Urine: 1.031 — ABNORMAL HIGH (ref 1.005–1.030)
pH: 5 (ref 5.0–8.0)

## 2017-01-23 LAB — WET PREP, GENITAL
CLUE CELLS WET PREP: NONE SEEN
Sperm: NONE SEEN
Trich, Wet Prep: NONE SEEN
YEAST WET PREP: NONE SEEN

## 2017-01-23 MED ORDER — ACETAMINOPHEN 500 MG PO TABS: 1000.0000 mg | ORAL_TABLET | Freq: Once | ORAL | Status: DC

## 2017-01-23 MED ORDER — CEPHALEXIN 500 MG PO CAPS: 500.0000 mg | ORAL_CAPSULE | Freq: Four times a day (QID) | ORAL | 0 refills | Status: DC

## 2017-01-23 NOTE — Telephone Encounter (Signed)
Patient called with concerns that she has RLQ pain radiating to back and dark red urine.  Patient was seen in MAU this morning and urine culture is still pending.  Reviewed with Dr. Elly Modena and she has sent an Rx for Keflex to patient's pharmacy. She requested I move up her appointment from Thursday of this week to Wednesday.  All of the above communicated with patient.

## 2017-01-23 NOTE — MAU Note (Signed)
Pt agreed to try tylenol then chose to go home un medicated instead.

## 2017-01-23 NOTE — MAU Provider Note (Signed)
History     CSN: [id number]  Arrival date and time: 01/22/17 2309   First Provider Initiated Contact with Patient 01/23/17 0053      Chief Complaint  Patient presents with  . Abdominal Pain   HPI  [name] is a 36 yo [id number] at 25.[redacted] wks gestation presenting tonight with complaints of RLQ pain since 6/24 and orange urine.  She has taken no meds.  She reports (+) FM. She was "tx'd 2 wks ago for yeast infection".    Past Medical History:  Diagnosis Date  . Anemia   . Endometriosis   . Fibroids   . Hypertension     Past Surgical History:  Procedure Laterality Date  . CESAREAN SECTION     4 c/s prior   . CESAREAN SECTION N/A 03/25/2015   Procedure: CESAREAN SECTION;  Surgeon: Truett Mainland, DO;  Location: [facility];  Service: Obstetrics;  Laterality: N/A;  Requested 03/25/15 @ 11:00a  . laporoscopy     to remove endometriosis    Family History  Problem Relation Age of Onset  . Diabetes Mother   . Hypertension Mother   . Diabetes Father   . Hearing loss Paternal Grandmother     Social History  Substance Use Topics  . Smoking status: Former Smoker    Packs/day: 0.50    Quit date: 07/30/2014  . Smokeless tobacco: Never Used  . Alcohol use No    Allergies: No Known Allergies  Prescriptions Prior to Admission  Medication Sig Dispense Refill Last Dose  . Prenat-FeAsp-Meth-FA-DHA w/o A (PRENATE PIXIE) 10-0.6-0.4-200 MG CAPS Take 1 tablet by mouth daily. 30 capsule 12 01/22/2017 at Unknown time  . Prenatal-DSS-FeCb-FeGl-FA (CITRANATAL BLOOM) 90-1 MG TABS Take 1 tablet by mouth daily. 30 tablet 12 01/22/2017 at Unknown time  . terconazole (TERAZOL 3) 0.8 % vaginal cream Place 1 applicator vaginally at bedtime. 20 g 0 Past Month at Unknown time  . albuterol (PROVENTIL HFA;VENTOLIN HFA) 108 (90 BASE) MCG/ACT inhaler Inhale 2 puffs into the lungs every 6 (six) hours as needed for wheezing or shortness of breath. (Patient not taking: Reported on 06/02/2015) 1 Inhaler 0  More than a month at Unknown time  . aspirin 81 MG chewable tablet Chew 1 tablet (81 mg total) by mouth daily. 30 tablet 12 More than a month at Unknown time  . potassium chloride (K-DUR) 10 MEQ tablet Take 1 tablet (10 mEq total) by mouth daily. 15 tablet 0 More than a month at Unknown time    Review of Systems  Constitutional: Negative.   HENT: Negative.   Eyes: Negative.   Respiratory: Negative.   Cardiovascular: Negative.   Endocrine: Negative.   Genitourinary: Positive for flank pain and pelvic pain (RLQ).       "orange urine"  Musculoskeletal: Positive for back pain (RT lower back).  Skin: Negative.   Allergic/Immunologic: Negative.   Neurological: Negative.   Hematological: Negative.   Psychiatric/Behavioral: Negative.    Physical Exam   Blood pressure (!) 112/58, pulse 73, temperature 98.2 F (36.8 C), temperature source Oral, resp. rate 20, height 5\' 8"  (1.727 m), weight 91.9 kg (202 lb 8 oz), last menstrual period 07/30/2016. Physical Exam  Constitutional: She is oriented to person, place, and time. She appears well-developed and well-nourished.  GI: Soft. Bowel sounds are normal.  Genitourinary:  Genitourinary Comments: Uterus: gravid, S=D, small amt of malodorous, thick, white d/c, closed/long/firm, no CMT or friability, no adnexal tenderness, WP obtained without speculum  Musculoskeletal: She exhibits tenderness (mild tenderness in RT lower back with palpation).  Neurological: She is alert and oriented to person, place, and time. She has normal reflexes.  Psychiatric: She has a normal mood and affect. Her behavior is normal. Judgment and thought content normal.    MAU Course  Procedures  MDM CCUA Wet Prep NST; FHR: 150 bpm / moderate variability / accels present / decels absent TOCO: none Offered Tylenol - patient agreed to take, but then refused immediately after provider left room OB UCx - pending  Results for orders placed or performed during the  hospital encounter of 01/22/17 (from the past 24 hour(s))  Urinalysis, Routine w reflex microscopic     Status: Abnormal   Collection Time: 01/22/17 11:22 PM  Result Value Ref Range   Color, Urine AMBER (A) YELLOW   APPearance HAZY (A) CLEAR   Specific Gravity, Urine 1.031 (H) 1.005 - 1.030   pH 5.0 5.0 - 8.0   Glucose, UA NEGATIVE NEGATIVE mg/dL   Hgb urine dipstick SMALL (A) NEGATIVE   Bilirubin Urine SMALL (A) NEGATIVE   Ketones, ur 5 (A) NEGATIVE mg/dL   Protein, ur 30 (A) NEGATIVE mg/dL   Nitrite NEGATIVE NEGATIVE   Leukocytes, UA SMALL (A) NEGATIVE   RBC / HPF TOO NUMEROUS TO COUNT 0 - 5 RBC/hpf   WBC, UA 0-5 0 - 5 WBC/hpf   Bacteria, UA RARE (A) NONE SEEN   Squamous Epithelial / LPF 6-30 (A) NONE SEEN   Mucous PRESENT    Ca Oxalate Crys, UA PRESENT   Wet prep, genital     Status: Abnormal   Collection Time: 01/23/17 12:56 AM  Result Value Ref Range   Yeast Wet Prep HPF POC NONE SEEN NONE SEEN   Trich, Wet Prep NONE SEEN NONE SEEN   Clue Cells Wet Prep HPF POC NONE SEEN NONE SEEN   WBC, Wet Prep HPF POC MODERATE (A) NONE SEEN   Sperm NONE SEEN    Assessment and Plan  Pregnancy with abdominal pain of right lower quadrant, antepartum - Patient desires to leave before [facility] results - states she will check My Chart and get Rx at next OB appt on 01/23/17, if needed - Discussed abd pain could be from RLP or possible vaginal infection - Instructions on abd pain in pregnancy  Discharge home Patient left angrily stating that "My doctor across the street sent me here; they're a part of y'all.  I don't know why I wasted my time here.  Y'all don't know what y'all are doing."  Laury Deep, MSN, CNM 01/23/2017, 12:56 AM

## 2017-01-23 NOTE — MAU Note (Signed)
Pt wanted to speak to provider prior to leaving re: her urine results, and the possibility of having BV.  When told that the provider was in a pt room and would be happy to talk to her as soon as she stepped out of the room, she said, 'no im not wasting any more time here" and she walked out.

## 2017-01-24 ENCOUNTER — Encounter: Payer: Medicaid Other | Admitting: Obstetrics and Gynecology

## 2017-01-25 ENCOUNTER — Encounter: Payer: Medicaid Other | Admitting: Obstetrics and Gynecology

## 2017-01-30 ENCOUNTER — Ambulatory Visit (HOSPITAL_COMMUNITY)
Admission: RE | Admit: 2017-01-30 | Discharge: 2017-01-30 | Disposition: A | Discharge status: Home / Self Care | Payer: Medicaid Other | Source: Ambulatory Visit | Attending: Certified Nurse Midwife | Admitting: Certified Nurse Midwife

## 2017-02-05 ENCOUNTER — Telehealth: Payer: Self-pay | Admitting: Advanced Practice Midwife

## 2017-02-05 DIAGNOSIS — B3731 Acute candidiasis of vulva and vagina: Secondary | ICD-10-CM

## 2017-02-05 DIAGNOSIS — B373 Candidiasis of vulva and vagina: Secondary | ICD-10-CM

## 2017-02-05 MED ORDER — TERCONAZOLE 0.4 % VA CREA: 1.0000 | TOPICAL_CREAM | Freq: Every day | VAGINAL | 1 refills | Status: DC

## 2017-02-05 NOTE — Telephone Encounter (Signed)
Pt called MAU to report that the medication she started taking after her MAU visit on 6/26, Keflex, has caused a yeast infection.  She describes her symptoms as itching with thick white discharge.  She denies any associated symptoms.    Rx for Terazol 7 sent to [facility] on [address] per pt request  Pt to follow up at [place] if symptoms persist or return to MAU for emergencies  Fatima Blank, CNM 9:46 PM

## 2017-02-09 ENCOUNTER — Encounter (HOSPITAL_COMMUNITY): Payer: Self-pay

## 2017-02-09 ENCOUNTER — Ambulatory Visit (HOSPITAL_COMMUNITY)
Admission: RE | Admit: 2017-02-09 | Discharge: 2017-02-09 | Disposition: A | Discharge status: Home / Self Care | Payer: Medicaid Other | Source: Ambulatory Visit | Attending: Certified Nurse Midwife | Admitting: Certified Nurse Midwife

## 2017-02-09 ENCOUNTER — Other Ambulatory Visit (HOSPITAL_COMMUNITY): Payer: Self-pay | Admitting: Maternal and Fetal Medicine

## 2017-02-09 DIAGNOSIS — O99212 Obesity complicating pregnancy, second trimester: Secondary | ICD-10-CM | POA: Insufficient documentation

## 2017-02-09 DIAGNOSIS — O34211 Maternal care for low transverse scar from previous cesarean delivery: Secondary | ICD-10-CM | POA: Diagnosis not present

## 2017-02-09 DIAGNOSIS — Z3A27 27 weeks gestation of pregnancy: Secondary | ICD-10-CM | POA: Diagnosis not present

## 2017-02-09 DIAGNOSIS — O09522 Supervision of elderly multigravida, second trimester: Secondary | ICD-10-CM | POA: Insufficient documentation

## 2017-02-09 DIAGNOSIS — O365920 Maternal care for other known or suspected poor fetal growth, second trimester, not applicable or unspecified: Secondary | ICD-10-CM | POA: Diagnosis not present

## 2017-02-09 DIAGNOSIS — O0932 Supervision of pregnancy with insufficient antenatal care, second trimester: Secondary | ICD-10-CM | POA: Diagnosis not present

## 2017-02-09 DIAGNOSIS — Z362 Encounter for other antenatal screening follow-up: Secondary | ICD-10-CM | POA: Diagnosis not present

## 2017-02-09 DIAGNOSIS — O10012 Pre-existing essential hypertension complicating pregnancy, second trimester: Secondary | ICD-10-CM | POA: Diagnosis not present

## 2017-02-09 DIAGNOSIS — O99332 Smoking (tobacco) complicating pregnancy, second trimester: Secondary | ICD-10-CM | POA: Insufficient documentation

## 2017-02-12 ENCOUNTER — Other Ambulatory Visit (HOSPITAL_COMMUNITY): Payer: Self-pay | Admitting: *Deleted

## 2017-02-12 DIAGNOSIS — IMO0002 Reserved for concepts with insufficient information to code with codable children: Secondary | ICD-10-CM

## 2017-02-28 ENCOUNTER — Other Ambulatory Visit: Payer: Medicaid Other

## 2017-02-28 ENCOUNTER — Encounter: Payer: Medicaid Other | Admitting: Obstetrics

## 2017-03-01 ENCOUNTER — Encounter: Payer: Self-pay | Admitting: *Deleted

## 2017-03-02 ENCOUNTER — Ambulatory Visit (HOSPITAL_COMMUNITY)
Admission: RE | Admit: 2017-03-02 | Discharge: 2017-03-02 | Disposition: A | Discharge status: Home / Self Care | Payer: Medicaid Other | Source: Ambulatory Visit | Attending: Certified Nurse Midwife | Admitting: Certified Nurse Midwife

## 2017-03-05 ENCOUNTER — Ambulatory Visit (HOSPITAL_COMMUNITY)
Admission: RE | Admit: 2017-03-05 | Discharge: 2017-03-05 | Disposition: A | Discharge status: Home / Self Care | Payer: Medicaid Other | Source: Ambulatory Visit | Attending: Certified Nurse Midwife | Admitting: Certified Nurse Midwife

## 2017-03-05 ENCOUNTER — Inpatient Hospital Stay (HOSPITAL_COMMUNITY)
Admission: AD | Admit: 2017-03-05 | Discharge: 2017-03-05 | Disposition: A | Discharge status: Home / Self Care | Payer: Medicaid Other | Source: Ambulatory Visit | Attending: Obstetrics & Gynecology | Admitting: Obstetrics & Gynecology

## 2017-03-05 ENCOUNTER — Encounter (HOSPITAL_COMMUNITY): Payer: Self-pay | Admitting: *Deleted

## 2017-03-05 ENCOUNTER — Encounter (HOSPITAL_COMMUNITY): Payer: Self-pay

## 2017-03-05 ENCOUNTER — Encounter: Payer: Medicaid Other | Admitting: Obstetrics and Gynecology

## 2017-03-05 ENCOUNTER — Other Ambulatory Visit (HOSPITAL_COMMUNITY): Payer: Self-pay | Admitting: Maternal and Fetal Medicine

## 2017-03-05 ENCOUNTER — Other Ambulatory Visit: Payer: Medicaid Other

## 2017-03-05 DIAGNOSIS — O34211 Maternal care for low transverse scar from previous cesarean delivery: Secondary | ICD-10-CM | POA: Diagnosis not present

## 2017-03-05 DIAGNOSIS — O10019 Pre-existing essential hypertension complicating pregnancy, unspecified trimester: Secondary | ICD-10-CM | POA: Insufficient documentation

## 2017-03-05 DIAGNOSIS — O99212 Obesity complicating pregnancy, second trimester: Secondary | ICD-10-CM

## 2017-03-05 DIAGNOSIS — O0932 Supervision of pregnancy with insufficient antenatal care, second trimester: Secondary | ICD-10-CM

## 2017-03-05 DIAGNOSIS — O09522 Supervision of elderly multigravida, second trimester: Secondary | ICD-10-CM

## 2017-03-05 DIAGNOSIS — Z6830 Body mass index (BMI) 30.0-30.9, adult: Secondary | ICD-10-CM | POA: Insufficient documentation

## 2017-03-05 DIAGNOSIS — O365920 Maternal care for other known or suspected poor fetal growth, second trimester, not applicable or unspecified: Secondary | ICD-10-CM | POA: Insufficient documentation

## 2017-03-05 DIAGNOSIS — Z7982 Long term (current) use of aspirin: Secondary | ICD-10-CM | POA: Diagnosis not present

## 2017-03-05 DIAGNOSIS — Z3A31 31 weeks gestation of pregnancy: Secondary | ICD-10-CM

## 2017-03-05 DIAGNOSIS — Z362 Encounter for other antenatal screening follow-up: Secondary | ICD-10-CM

## 2017-03-05 DIAGNOSIS — Z87891 Personal history of nicotine dependence: Secondary | ICD-10-CM | POA: Diagnosis not present

## 2017-03-05 DIAGNOSIS — O34219 Maternal care for unspecified type scar from previous cesarean delivery: Secondary | ICD-10-CM

## 2017-03-05 DIAGNOSIS — IMO0002 Reserved for concepts with insufficient information to code with codable children: Secondary | ICD-10-CM

## 2017-03-05 DIAGNOSIS — O365930 Maternal care for other known or suspected poor fetal growth, third trimester, not applicable or unspecified: Secondary | ICD-10-CM | POA: Insufficient documentation

## 2017-03-05 DIAGNOSIS — O99332 Smoking (tobacco) complicating pregnancy, second trimester: Secondary | ICD-10-CM | POA: Diagnosis not present

## 2017-03-05 DIAGNOSIS — Z3689 Encounter for other specified antenatal screening: Secondary | ICD-10-CM

## 2017-03-05 NOTE — Procedures (Signed)
Wendy Floyd 07-25-1981 [redacted]w[redacted]d  Fetus A Non-Stress Test Interpretation for 03/05/17  Indication: Unsatisfactory BPP  Fetal Heart Rate A Mode: External Baseline Rate (A): 145 bpm Variability: Moderate Accelerations: None Decelerations: None  Uterine Activity Mode: Toco Contraction Frequency (min): none noted  Interpretation (Fetal Testing) Nonstress Test Interpretation: Non-reactive Comments: Dr. Lucia Gaskins to review tracing.

## 2017-03-05 NOTE — ED Notes (Signed)
Dr. Ihor Dow notified of pt.  Pt to go to MAU for prolonged monitoring.  Velna Ochs, RN notified. Pt to MAU.

## 2017-03-05 NOTE — Discharge Instructions (Signed)
Biophysical Profile °A biophysical profile is a non-invasive test that may be done during pregnancy to check that your developing baby (fetus) and your placenta are healthy. Your health care provider may recommend a biophysical profile if your pregnancy is at a higher risk for certain problems. A biophysical profile is usually done during the last 3 months of pregnancy (third trimester). °A biophysical profile combines two tests to check the health of your baby. In one test, you will have a device strapped to your belly to measure your baby’s heart rate. The other test involves using sound waves and a computer (ultrasound) to create an image of your baby inside your womb (uterus). Together, these tests tell your health care provider about the overall health of your baby. °Tell a health care provider about: °· Any allergies you have. °· All medicines you are taking, including vitamins, herbs, eye drops, creams, and over-the-counter medicines. °· Any medical conditions you have. °· Any concerns you have about your pregnancy. °· Any symptoms such as abdominal pain or contractions, nausea or vomiting, vaginal bleeding, leaking of amniotic fluid, decreased fetal movements, fever or infection, increased swelling, headaches, or visual disturbances. °· How often you feel your baby move. °What are the risks? °There are no risks to you or your baby from a biophysical profile. °What happens before the procedure? °Ask your health care provider how to prepare. °· You may need to drink fluids so that you have a full bladder for your ultrasound. °· You may also need to eat before you arrive for the test. That makes your baby more active. ° °What happens during the procedure? °· You will lie on your back on an exam table. °· Your blood pressure may be monitored during the procedure. °· A belt will be placed around your belly. The belt has a sensor to measure your baby’s heart rate. °· You may have to wear another belt and sensor to  measure any muscle movements (contractions) in your uterus. During the ultrasound, a health care provider or technician will gently roll a handheld device (transducer) over your belly. This device sends signals to a computer that creates images of your baby. °· Five areas of your baby’s health and development will be checked during the biophysical profile: °? Heart rate. °? Breathing. °? Movement. °? Active muscle movement (muscle tone). °? The amount of fluid in your uterus (amniotic fluid). °The procedure may vary among health care providers and hospitals. °What happens after the procedure? °· Your health care provider will discuss your results with you. The results of a biophysical profile are scored in a range of 0 to 10. Each area that is evaluated is given a score of 0 or 2 points. If you get a score of 6 or less, you may need further testing, or your baby may need to be delivered early. A score of 8 to 10 with normal amniotic fluid levels is considered normal. °· Unless you need additional testing, you can go home right after the procedure and resume your normal activities. °Summary °· A biophysical profile is a non-invasive test that may be done during pregnancy to check that your developing baby (fetus) and your placenta are healthy. °· A biophysical profile combines two tests: A test to measure your baby's heart rate and an ultrasound test to create an image of your baby in the womb. °· Tell your health care provider about any concerns you have about your pregnancy or any pregnancy-related symptoms. °· If you   get a score of 6 or less, you may need further testing, or your baby may need to be delivered early. A score of 8 to 10 with normal amniotic fluid levels is considered normal. °This information is not intended to replace advice given to you by your health care provider. Make sure you discuss any questions you have with your health care provider. °Document Released: 07/07/2002 Document Revised:  08/18/2016 Document Reviewed: 08/18/2016 °[employer] Interactive Patient Education © 2018 [employer] ° °

## 2017-03-05 NOTE — MAU Provider Note (Signed)
History     CSN: [id number]  Arrival date and time: 03/05/17 1359   First Provider Initiated Contact with Patient 03/05/17 1553      Chief Complaint  Patient presents with  . Non reactive NST  . BPP 6/8   HPI   Wendy Floyd is a 36 y.o. female [phone] @ [redacted]w[redacted]d here in MAU from [facility] for further fetal monitoring. The patient was there today having a BPP and scored a 6/8; Dr. Jacinto Reap recommended further fetal monitoring. + fetal movement, she denies vaginal bleeding.   OB History    Gravida Para Term Preterm AB Living   6 5 5  0 0 5   SAB TAB Ectopic Multiple Live Births   0 0 0 0 5      Past Medical History:  Diagnosis Date  . Anemia   . Endometriosis   . Fibroids   . Hypertension     Past Surgical History:  Procedure Laterality Date  . CESAREAN SECTION     4 c/s prior   . CESAREAN SECTION N/A 03/25/2015   Procedure: CESAREAN SECTION;  Surgeon: Truett Mainland, DO;  Location: [facility];  Service: Obstetrics;  Laterality: N/A;  Requested 03/25/15 @ 11:00a  . laporoscopy     to remove endometriosis    Family History  Problem Relation Age of Onset  . Diabetes Mother   . Hypertension Mother   . Diabetes Father   . Hearing loss Paternal Grandmother     Social History  Substance Use Topics  . Smoking status: Former Smoker    Packs/day: 0.50    Quit date: 07/30/2014  . Smokeless tobacco: Never Used  . Alcohol use No    Allergies: No Known Allergies  Prescriptions Prior to Admission  Medication Sig Dispense Refill Last Dose  . albuterol (PROVENTIL HFA;VENTOLIN HFA) 108 (90 BASE) MCG/ACT inhaler Inhale 2 puffs into the lungs every 6 (six) hours as needed for wheezing or shortness of breath. (Patient not taking: Reported on 06/02/2015) 1 Inhaler 0 Not Taking  . aspirin 81 MG chewable tablet Chew 1 tablet (81 mg total) by mouth daily. (Patient not taking: Reported on 03/05/2017) 30 tablet 12 Not Taking  . cephALEXin (KEFLEX) 500 MG capsule Take 1 capsule (500 mg total)  by mouth 4 (four) times daily. (Patient not taking: Reported on 03/05/2017) 28 capsule 0 Not Taking  . potassium chloride (K-DUR) 10 MEQ tablet Take 1 tablet (10 mEq total) by mouth daily. (Patient not taking: Reported on 03/05/2017) 15 tablet 0 Not Taking  . Prenat-FeAsp-Meth-FA-DHA w/o A (PRENATE PIXIE) 10-0.6-0.4-200 MG CAPS Take 1 tablet by mouth daily. 30 capsule 12 Taking  . Prenatal-DSS-FeCb-FeGl-FA (CITRANATAL BLOOM) 90-1 MG TABS Take 1 tablet by mouth daily. (Patient not taking: Reported on 03/05/2017) 30 tablet 12 Not Taking  . terconazole (TERAZOL 3) 0.8 % vaginal cream Place 1 applicator vaginally at bedtime. (Patient not taking: Reported on 03/05/2017) 20 g 0 Not Taking  . terconazole (TERAZOL 7) 0.4 % vaginal cream Place 1 applicator vaginally at bedtime. (Patient not taking: Reported on 03/05/2017) 45 g 1 Not Taking   No results found for this or any previous visit (from the past 51 hour(s)).   [place] Mfm Fetal Bpp Wo Non Stress  Result Date: 03/06/2017 ----------------------------------------------------------------------  OBSTETRICS REPORT                        (Corrected Final 03/06/2017 09:43  am) ---------------------------------------------------------------------- Patient Info  ID #:       [id number]                          D.O.B.:  [birth year] (36 yrs)  Name:       Wendy Floyd                 Visit Date: 03/05/2017 11:29 am ---------------------------------------------------------------------- Performed By  Performed By:     Corky Crafts             Ref. Address:     [place]                                                             [place]  Attending:        Griffin Dakin MD         Location:         [facility]  Referred By:      Morene Crocker CNM ---------------------------------------------------------------------- Orders   #  Description                                 Code   1  [place] MFM OB FOLLOW UP                         [id number]   2  [place] MFM FETAL BPP WO NON STRESS              76819.01   3  [place] MFM UA CORD DOPPLER                      76820.02  ----------------------------------------------------------------------   #  Ordered By               Order #        Accession #    Episode #   1  Renella Cunas            [id number]      [id number]     [id number]   2  MARK NEWMAN              [id number]  [id number]     [id number]   [place]              [id number]      [id number]     [id number]  ---------------------------------------------------------------------- Indications   [redacted] weeks gestation of pregnancy                Y1V.49   Obesity complicating pregnancy, second         O99.212   trimester   Advanced maternal age multigravida 28+,        O79.522   second trimester (low risk NIPS)   Late prenatal care, second trimester           O09.32   History of cesarean delivery, currently        O34.219   pregnant (x4)   Hypertension - Chronic/Pre-existing; ASA       S49.675   Smoking complicating pregnancy, second         O99.332   trimester   Maternal care for known or suspected poor      O36.5920   fetal growth, second trimester, not applicable   or unspecified   Encounter for other antenatal screening        Z36.2   follow-up  ---------------------------------------------------------------------- OB History  Blood Type:            Height:  5'8"   Weight (lb):  202       BMI:  30.71  Gravidity:    6         Term:   5        Prem:   0        SAB:   0  TOP:          0       Ectopic:  0        Living: 5 ---------------------------------------------------------------------- Fetal Evaluation  Num Of Fetuses:     1  Fetal Heart         148  Rate(bpm):  Cardiac Activity:   Observed  Presentation:       Cephalic  Placenta:            Posterior, above cervical os  P. Cord Insertion:  Previously Visualized  Amniotic Fluid  AFI FV:      Subjectively within normal limits  AFI Sum(cm)     %Tile       Largest Pocket(cm)  14.35           50          5.32  RUQ(cm)       RLQ(cm)       LUQ(cm)        LLQ(cm)  5.32          2.77          3.86           2.4 ---------------------------------------------------------------------- Biophysical Evaluation  Amniotic F.V:   Within normal limits       F. Tone:        Observed  F. Movement:    Observed                   Score:          6/8  F. Breathing:   Not Observed ---------------------------------------------------------------------- Biometry  BPD:      81.2  mm     G. Age:  32w 4d         82  %  CI:        78.16   %    70 - 86                                                          FL/HC:      19.7   %    19.3 - 21.3  HC:      290.6  mm     G. Age:  32w 0d         37  %    HC/AC:      1.17        0.96 - 1.17  AC:      248.3  mm     G. Age:  29w 0d          5  %    FL/BPD:     70.6   %    71 - 87  FL:       57.3  mm     G. Age:  30w 0d         12  %    FL/AC:      23.1   %    20 - 24  HUM:        52  mm     G. Age:  30w 2d         36  %  Est. FW:    1476  gm      3 lb 4 oz     30  % ---------------------------------------------------------------------- Gestational Age  LMP:           31w 1d        Date:  07/30/16                 EDD:   05/06/17  U/S Today:     30w 6d                                        EDD:   05/08/17  Best:          31w 1d     Det. By:  LMP  (07/30/16)          EDD:   05/06/17 ---------------------------------------------------------------------- Anatomy  Cranium:               Appears normal         Aortic Arch:            Previously seen  Cavum:                 Previously seen        Ductal Arch:            Previously seen  Ventricles:            Appears normal         Diaphragm:              Appears normal  Choroid Plexus:        Previously seen        Stomach:                Appears  normal, left  sided  Cerebellum:            Previously seen        Abdomen:                Previously seen  Posterior Fossa:       Previously seen        Abdominal Wall:         Previously seen  Nuchal Fold:           Not applicable (>83    Cord Vessels:           Previously seen                         wks GA)  Face:                  Orbits nl; profile nwv Kidneys:                Appear normal  Lips:                  Appears normal         Bladder:                Appears normal  Thoracic:              Appears normal         Spine:                  Previously seen  Heart:                 Previously seen        Upper Extremities:      Previously seen  RVOT:                  Appears normal         Lower Extremities:      Previously seen  LVOT:                  Previously seen  Other:  Fetus appears to be a female prev seen. Technically difficult due to          maternal habitus and fetal position. ---------------------------------------------------------------------- Doppler - Fetal Vessels  Umbilical Artery   S/D     %tile                                     PSV    ADFV    RDFV                                                   (cm/s)  2.11       13                                     40.56      No      No ---------------------------------------------------------------------- Cervix Uterus Adnexa  Cervix  Not visualized (advanced GA >29wks) ---------------------------------------------------------------------- Impression  SIUP at 31+1 weeks with low abdominal circumfrence  Normal interval anatomy; anatomic survey complete  Normal amniotic fluid volume  Appropriate interval growth with EFW at the  30th percentile;  AC at the 5th percentile  UA dopplers were normal for this GA  BPP 6/8, breathing present but did not meet criteria for  biophysical profile ---------------------------------------------------------------------- Recommendations  NST today   Due to low [facility] but normal composite EFW, would repeat  scan in 3 weeks and initiate antepartum fetal testing. This can  be either weekly BPP or twice-weekly NST ----------------------------------------------------------------------                      Griffin Dakin, MD Electronically Signed Corrected Final Report  03/06/2017 09:43 am ----------------------------------------------------------------------  [place] Mfm Ob Follow Up  Result Date: 03/06/2017 ----------------------------------------------------------------------  OBSTETRICS REPORT                        (Corrected Final 03/06/2017 09:43                                                                          am) ---------------------------------------------------------------------- Patient Info  ID #:       [id number]                          D.O.B.:  May 31, 1981 (36 yrs)  Name:       KETZALY CARDELLA                 Visit Date: 03/05/2017 11:29 am ---------------------------------------------------------------------- Performed By  Performed By:     Corky Crafts             Ref. Address:     [place]                                                             [place]  Attending:        Griffin Dakin MD         Location:         [facility]  Referred By:      Morene Crocker CNM ---------------------------------------------------------------------- Orders   #  Description  Code   1  [place] MFM OB FOLLOW UP                         [id number]   2  [place] MFM FETAL BPP WO NON STRESS              76819.01   3  [place] MFM UA CORD DOPPLER                      76820.02  ----------------------------------------------------------------------   #  Ordered By               Order #        Accession #    Episode #   1  Renella Cunas            [id number]       [id number]     [id number]   2  MARK NEWMAN              [id number]      [id number]     [id number]   3  MARK NEWMAN              [id number]      [id number]     [id number]  ---------------------------------------------------------------------- Indications   [redacted] weeks gestation of pregnancy                F3L.45   Obesity complicating pregnancy, second         O99.212   trimester   Advanced maternal age multigravida 69+,        O66.522   second trimester (low risk NIPS)   Late prenatal care, second trimester           O09.32   History of cesarean delivery, currently        O34.219   pregnant (x4)   Hypertension - Chronic/Pre-existing; ASA       G25.638   Smoking complicating pregnancy, second         O99.332   trimester   Maternal care for known or suspected poor      O36.5920   fetal growth, second trimester, not applicable   or unspecified   Encounter for other antenatal screening        Z36.2   follow-up  ---------------------------------------------------------------------- OB History  Blood Type:            Height:  5'8"   Weight (lb):  202       BMI:  30.71  Gravidity:    6         Term:   5        Prem:   0        SAB:   0  TOP:          0       Ectopic:  0        Living: 5 ---------------------------------------------------------------------- Fetal Evaluation  Num Of Fetuses:     1  Fetal Heart         148  Rate(bpm):  Cardiac Activity:   Observed  Presentation:       Cephalic  Placenta:           Posterior, above cervical os  P. Cord Insertion:  Previously Visualized  Amniotic Fluid  AFI FV:      Subjectively within normal limits  AFI Sum(cm)     %Tile       Largest  Pocket(cm)  14.35           50          5.32  RUQ(cm)       RLQ(cm)       LUQ(cm)        LLQ(cm)  5.32          2.77          3.86           2.4 ---------------------------------------------------------------------- Biophysical Evaluation  Amniotic F.V:   Within normal limits       F. Tone:        Observed  F. Movement:    Observed                    Score:          6/8  F. Breathing:   Not Observed ---------------------------------------------------------------------- Biometry  BPD:      81.2  mm     G. Age:  32w 4d         82  %    CI:        78.16   %    70 - 86                                                          FL/HC:      19.7   %    19.3 - 21.3  HC:      290.6  mm     G. Age:  32w 0d         37  %    HC/AC:      1.17        0.96 - 1.17  AC:      248.3  mm     G. Age:  29w 0d          5  %    FL/BPD:     70.6   %    71 - 87  FL:       57.3  mm     G. Age:  30w 0d         12  %    FL/AC:      23.1   %    20 - 24  HUM:        52  mm     G. Age:  30w 2d         36  %  Est. FW:    1476  gm      3 lb 4 oz     30  % ---------------------------------------------------------------------- Gestational Age  LMP:           31w 1d        Date:  07/30/16                 EDD:   05/06/17  U/S Today:     30w 6d                                        EDD:   05/08/17  Best:          31w 1d  Det. By:  LMP  (07/30/16)          EDD:   05/06/17 ---------------------------------------------------------------------- Anatomy  Cranium:               Appears normal         Aortic Arch:            Previously seen  Cavum:                 Previously seen        Ductal Arch:            Previously seen  Ventricles:            Appears normal         Diaphragm:              Appears normal  Choroid Plexus:        Previously seen        Stomach:                Appears normal, left                                                                        sided  Cerebellum:            Previously seen        Abdomen:                Previously seen  Posterior Fossa:       Previously seen        Abdominal Wall:         Previously seen  Nuchal Fold:           Not applicable (>09    Cord Vessels:           Previously seen                         wks GA)  Face:                  Orbits nl; profile nwv Kidneys:                Appear normal  Lips:                  Appears normal         Bladder:                 Appears normal  Thoracic:              Appears normal         Spine:                  Previously seen  Heart:                 Previously seen        Upper Extremities:      Previously seen  RVOT:                  Appears normal         Lower Extremities:      Previously seen  LVOT:  Previously seen  Other:  Fetus appears to be a female prev seen. Technically difficult due to          maternal habitus and fetal position. ---------------------------------------------------------------------- Doppler - Fetal Vessels  Umbilical Artery   S/D     %tile                                     PSV    ADFV    RDFV                                                   (cm/s)  2.11       13                                     40.56      No      No ---------------------------------------------------------------------- Cervix Uterus Adnexa  Cervix  Not visualized (advanced GA >29wks) ---------------------------------------------------------------------- Impression  SIUP at 31+1 weeks with low abdominal circumfrence  Normal interval anatomy; anatomic survey complete  Normal amniotic fluid volume  Appropriate interval growth with EFW at the 30th percentile;  AC at the 5th percentile  UA dopplers were normal for this GA  BPP 6/8, breathing present but did not meet criteria for  biophysical profile ---------------------------------------------------------------------- Recommendations  NST today  Due to low AC but normal composite EFW, would repeat  scan in 3 weeks and initiate antepartum fetal testing. This can  be either weekly BPP or twice-weekly NST ----------------------------------------------------------------------                      Griffin Dakin, MD Electronically Signed Corrected Final Report  03/06/2017 09:43 am ----------------------------------------------------------------------  [place] Mfm Ua Cord Doppler  Result Date: 03/06/2017 ----------------------------------------------------------------------   OBSTETRICS REPORT                        (Corrected Final 03/06/2017 09:43                                                                          am) ---------------------------------------------------------------------- Patient Info  ID #:       [id number]                          D.O.B.:  04-16-1981 (36 yrs)  Name:       PRESSLEY BARSKY                 Visit Date: 03/05/2017 11:29 am ---------------------------------------------------------------------- Performed By  Performed By:     Corky Crafts             Ref. Address:     [address]                    [place]  [address] [place]                                                             [place]  Attending:        Griffin Dakin MD         Location:         [facility]  Referred By:      Morene Crocker CNM ---------------------------------------------------------------------- Orders   #  Description                                 Code   1  [place] MFM OB FOLLOW UP                         [id number]   2  [place] MFM FETAL BPP WO NON STRESS              76819.01   3  [place] MFM UA CORD DOPPLER                      76820.02  ----------------------------------------------------------------------   #  Ordered By               Order #        Accession #    Episode #   1  MARTHA DECKER            [id number]      [id number]     [id number]   2  MARK NEWMAN              [id number]      [id number]     [id number]   3  MARK NEWMAN              [id number]      [id number]     [id number]  ---------------------------------------------------------------------- Indications   [redacted] weeks gestation of pregnancy                L7L.89   Obesity complicating pregnancy, second         O99.212   trimester   Advanced maternal age multigravida 12+,        O75.522   second trimester (low risk NIPS)   Late prenatal care, second trimester           O09.32    History of cesarean delivery, currently        O34.219   pregnant (x4)   Hypertension - Chronic/Pre-existing; ASA       Q11.941   Smoking complicating pregnancy, second         D40.814   trimester   Maternal care for known or suspected poor      O36.5920   fetal  growth, second trimester, not applicable   or unspecified   Encounter for other antenatal screening        Z36.2   follow-up  ---------------------------------------------------------------------- OB History  Blood Type:            Height:  5'8"   Weight (lb):  202       BMI:  30.71  Gravidity:    6         Term:   5        Prem:   0        SAB:   0  TOP:          0       Ectopic:  0        Living: 5 ---------------------------------------------------------------------- Fetal Evaluation  Num Of Fetuses:     1  Fetal Heart         148  Rate(bpm):  Cardiac Activity:   Observed  Presentation:       Cephalic  Placenta:           Posterior, above cervical os  P. Cord Insertion:  Previously Visualized  Amniotic Fluid  AFI FV:      Subjectively within normal limits  AFI Sum(cm)     %Tile       Largest Pocket(cm)  14.35           50          5.32  RUQ(cm)       RLQ(cm)       LUQ(cm)        LLQ(cm)  5.32          2.77          3.86           2.4 ---------------------------------------------------------------------- Biophysical Evaluation  Amniotic F.V:   Within normal limits       F. Tone:        Observed  F. Movement:    Observed                   Score:          6/8  F. Breathing:   Not Observed ---------------------------------------------------------------------- Biometry  BPD:      81.2  mm     G. Age:  32w 4d         82  %    CI:        78.16   %    70 - 86                                                          FL/HC:      19.7   %    19.3 - 21.3  HC:      290.6  mm     G. Age:  32w 0d         37  %    HC/AC:      1.17        0.96 - 1.17  AC:      248.3  mm     G. Age:  29w 0d          5  %    FL/BPD:     70.6   %  71 - 87  FL:       57.3  mm     G. Age:   30w 0d         12  %    FL/AC:      23.1   %    20 - 24  HUM:        52  mm     G. Age:  30w 2d         36  %  Est. FW:    1476  gm      3 lb 4 oz     30  % ---------------------------------------------------------------------- Gestational Age  LMP:           31w 1d        Date:  07/30/16                 EDD:   05/06/17  U/S Today:     30w 6d                                        EDD:   05/08/17  Best:          31w 1d     Det. By:  LMP  (07/30/16)          EDD:   05/06/17 ---------------------------------------------------------------------- Anatomy  Cranium:               Appears normal         Aortic Arch:            Previously seen  Cavum:                 Previously seen        Ductal Arch:            Previously seen  Ventricles:            Appears normal         Diaphragm:              Appears normal  Choroid Plexus:        Previously seen        Stomach:                Appears normal, left                                                                        sided  Cerebellum:            Previously seen        Abdomen:                Previously seen  Posterior Fossa:       Previously seen        Abdominal Wall:         Previously seen  Nuchal Fold:           Not applicable (>59    Cord Vessels:           Previously seen  wks GA)  Face:                  Orbits nl; profile nwv Kidneys:                Appear normal  Lips:                  Appears normal         Bladder:                Appears normal  Thoracic:              Appears normal         Spine:                  Previously seen  Heart:                 Previously seen        Upper Extremities:      Previously seen  RVOT:                  Appears normal         Lower Extremities:      Previously seen  LVOT:                  Previously seen  Other:  Fetus appears to be a female prev seen. Technically difficult due to          maternal habitus and fetal position. ----------------------------------------------------------------------  Doppler - Fetal Vessels  Umbilical Artery   S/D     %tile                                     PSV    ADFV    RDFV                                                   (cm/s)  2.11       13                                     40.56      No      No ---------------------------------------------------------------------- Cervix Uterus Adnexa  Cervix  Not visualized (advanced GA >29wks) ---------------------------------------------------------------------- Impression  SIUP at 31+1 weeks with low abdominal circumfrence  Normal interval anatomy; anatomic survey complete  Normal amniotic fluid volume  Appropriate interval growth with EFW at the 30th percentile;  AC at the 5th percentile  UA dopplers were normal for this GA  BPP 6/8, breathing present but did not meet criteria for  biophysical profile ---------------------------------------------------------------------- Recommendations  NST today  Due to low AC but normal composite EFW, would repeat  scan in 3 weeks and initiate antepartum fetal testing. This can  be either weekly BPP or twice-weekly NST ----------------------------------------------------------------------                      Griffin Dakin, MD Electronically Signed Corrected Final Report  03/06/2017 09:43 am ----------------------------------------------------------------------  Review of Systems  Constitutional: Negative for fever.  Gastrointestinal: Negative for abdominal pain.  Genitourinary: Negative for vaginal bleeding, vaginal discharge and vaginal pain.  Physical Exam   Blood pressure 127/70, pulse (!) 102, temperature 97.6 F (36.4 C), temperature source Oral, resp. rate 20, last menstrual period 07/30/2016, not currently breastfeeding.  Physical Exam  Constitutional: She is oriented to person, place, and time. She appears well-developed and well-nourished. No distress.  HENT:  Head: Normocephalic.  Musculoskeletal: Normal range of motion.  Neurological: She is alert and oriented to  person, place, and time.  Skin: Skin is warm. She is not diaphoretic.  Psychiatric: Her behavior is normal.   Fetal Tracing: Baseline: 135 bpm Variability: Moderate  Accelerations: 10x10 Decelerations: quick variables <20 secs.  Toco: None Appropriate fetal tracing for [redacted] week gestation.   MAU Course  Procedures  None  MDM  Discussed patient presentation with Dr. Ihor Dow we also reviewed fetal tracing. [place] for DC home.   Assessment and Plan   A:  1. NST (non-stress test) reactive     P:  Discharge home in stable condition Message sent to the office to schedule twice weekly NST's. Next NST should be Thursday 8/9 Return to MAU if symptoms worsen  Rosy Estabrook, Anderson Malta I, NP 03/06/2017 10:59 AM

## 2017-03-06 ENCOUNTER — Other Ambulatory Visit (HOSPITAL_COMMUNITY): Payer: Self-pay | Admitting: *Deleted

## 2017-03-06 DIAGNOSIS — O365930 Maternal care for other known or suspected poor fetal growth, third trimester, not applicable or unspecified: Secondary | ICD-10-CM

## 2017-03-12 ENCOUNTER — Ambulatory Visit (INDEPENDENT_AMBULATORY_CARE_PROVIDER_SITE_OTHER): Payer: Medicaid Other | Admitting: Obstetrics and Gynecology

## 2017-03-12 ENCOUNTER — Encounter (HOSPITAL_COMMUNITY): Payer: Self-pay

## 2017-03-12 ENCOUNTER — Other Ambulatory Visit: Payer: Self-pay | Admitting: Obstetrics and Gynecology

## 2017-03-12 ENCOUNTER — Other Ambulatory Visit: Payer: Medicaid Other

## 2017-03-12 VITALS — BP 129/77 | HR 84 | Wt 201.0 lb

## 2017-03-12 DIAGNOSIS — O10913 Unspecified pre-existing hypertension complicating pregnancy, third trimester: Secondary | ICD-10-CM | POA: Diagnosis not present

## 2017-03-12 DIAGNOSIS — O0993 Supervision of high risk pregnancy, unspecified, third trimester: Secondary | ICD-10-CM

## 2017-03-12 DIAGNOSIS — O0933 Supervision of pregnancy with insufficient antenatal care, third trimester: Secondary | ICD-10-CM

## 2017-03-12 DIAGNOSIS — O10919 Unspecified pre-existing hypertension complicating pregnancy, unspecified trimester: Secondary | ICD-10-CM

## 2017-03-12 DIAGNOSIS — O0932 Supervision of pregnancy with insufficient antenatal care, second trimester: Secondary | ICD-10-CM

## 2017-03-12 DIAGNOSIS — O0990 Supervision of high risk pregnancy, unspecified, unspecified trimester: Secondary | ICD-10-CM

## 2017-03-12 DIAGNOSIS — O09523 Supervision of elderly multigravida, third trimester: Secondary | ICD-10-CM

## 2017-03-12 MED ORDER — VITAFOL ULTRA 29-0.6-0.4-200 MG PO CAPS: 1.0000 | ORAL_CAPSULE | Freq: Every day | ORAL | 12 refills | Status: DC

## 2017-03-12 MED ORDER — ENSURE HEALTHY MOM PO LIQD: ORAL | 0 refills | Status: DC

## 2017-03-12 NOTE — Progress Notes (Signed)
ROB/GTT here with her daughter whom has pink eye.

## 2017-03-12 NOTE — Progress Notes (Signed)
   PRENATAL VISIT NOTE  Subjective:  JENICKA COXE is a 36 y.o. G6P5005 at [redacted]w[redacted]d being seen today for ongoing prenatal care.  She is currently monitored for the following issues for this high-risk pregnancy and has Smoker; Status post cesarean section; Supervision of high risk pregnancy, antepartum; Late prenatal care affecting pregnancy in second trimester; Chronic hypertension during pregnancy, antepartum; Advanced maternal age in multigravida; Anemia affecting pregnancy in second trimester; and Low serum potassium on her problem list.  Patient reports no complaints.  Contractions: Irregular. Vag. Bleeding: None.  Movement: Present. Denies leaking of fluid.   The following portions of the patient's history were reviewed and updated as appropriate: allergies, current medications, past family history, past medical history, past social history, past surgical history and problem list. Problem list updated.  Objective:   Vitals:   03/12/17 1021  BP: 129/77  Pulse: 84  Weight: 201 lb (91.2 kg)    Fetal Status: Fetal Heart Rate (bpm): 136 Fundal Height: 31 cm Movement: Present     General:  Alert, oriented and cooperative. Patient is in no acute distress.  Skin: Skin is warm and dry. No rash noted.   Cardiovascular: Normal heart rate noted  Respiratory: Normal respiratory effort, no problems with respiration noted  Abdomen: Soft, gravid, appropriate for gestational age.  Pain/Pressure: Present     Pelvic: Cervical exam deferred        Extremities: Normal range of motion.  Edema: Trace  Mental Status:  Normal mood and affect. Normal behavior. Normal judgment and thought content.   Assessment and Plan:  Pregnancy: [id number] at [redacted]w[redacted]d  1. Supervision of high risk pregnancy, antepartum Patient has missed several appointment without clear explanation Will schedule patient for repeat c-section at 39 weeks Third trimester labs today - Glucose Tolerance, 2 Hours w/1 Hour - CBC - HIV antibody  (with reflex) - RPR - Fetal nonstress test  2. Elderly multigravida in third trimester Nl NIPS  3. Chronic hypertension during pregnancy, antepartum Well controlled without medication Follow up growth ultrasound schedueld - Fetal nonstress test- NST reviewed and reactive with baseline 130, mod variability, +accels, no decels  4. Late prenatal care affecting pregnancy in second trimester Insufficient prenatal care  Preterm labor symptoms and general obstetric precautions including but not limited to vaginal bleeding, contractions, leaking of fluid and fetal movement were reviewed in detail with the patient. Please refer to After Visit Summary for other counseling recommendations.  No Follow-up on file.   Mora Bellman, MD

## 2017-03-13 LAB — CBC
Hematocrit: 33.4 % — ABNORMAL LOW (ref 34.0–46.6)
Hemoglobin: 10.9 g/dL — ABNORMAL LOW (ref 11.1–15.9)
MCH: 26.1 pg — AB (ref 26.6–33.0)
MCHC: 32.6 g/dL (ref 31.5–35.7)
MCV: 80 fL (ref 79–97)
PLATELETS: 259 10*3/uL (ref 150–379)
RBC: 4.18 x10E6/uL (ref 3.77–5.28)
RDW: 16 % — AB (ref 12.3–15.4)
WBC: 11.3 10*3/uL — AB (ref 3.4–10.8)

## 2017-03-13 LAB — GLUCOSE TOLERANCE, 2 HOURS W/ 1HR
GLUCOSE, FASTING: 91 mg/dL (ref 65–91)
Glucose, 1 hour: 137 mg/dL (ref 65–179)
Glucose, 2 hour: 82 mg/dL (ref 65–152)

## 2017-03-13 LAB — RPR: RPR Ser Ql: NONREACTIVE

## 2017-03-13 LAB — HIV ANTIBODY (ROUTINE TESTING W REFLEX): HIV Screen 4th Generation wRfx: NONREACTIVE

## 2017-03-26 ENCOUNTER — Encounter (HOSPITAL_COMMUNITY): Payer: Self-pay

## 2017-03-26 ENCOUNTER — Ambulatory Visit (HOSPITAL_COMMUNITY)
Admission: RE | Admit: 2017-03-26 | Discharge: 2017-03-26 | Disposition: A | Discharge status: Home / Self Care | Payer: Medicaid Other | Source: Ambulatory Visit | Attending: Obstetrics and Gynecology | Admitting: Obstetrics and Gynecology

## 2017-04-09 ENCOUNTER — Encounter: Payer: Medicaid Other | Admitting: Obstetrics and Gynecology

## 2017-04-09 ENCOUNTER — Other Ambulatory Visit: Payer: Medicaid Other

## 2017-04-10 ENCOUNTER — Encounter (HOSPITAL_COMMUNITY): Payer: Self-pay

## 2017-04-12 ENCOUNTER — Telehealth: Payer: Self-pay | Admitting: Family

## 2017-04-12 ENCOUNTER — Other Ambulatory Visit: Payer: Self-pay | Admitting: Family

## 2017-04-12 ENCOUNTER — Encounter: Payer: Self-pay | Admitting: *Deleted

## 2017-04-12 DIAGNOSIS — O0990 Supervision of high risk pregnancy, unspecified, unspecified trimester: Secondary | ICD-10-CM

## 2017-04-12 MED ORDER — VITAFOL ULTRA 29-0.6-0.4-200 MG PO CAPS: 1.0000 | ORAL_CAPSULE | Freq: Every day | ORAL | 12 refills | Status: AC
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
## 2017-04-13 ENCOUNTER — Other Ambulatory Visit: Payer: Medicaid Other

## 2017-04-13 ENCOUNTER — Encounter: Payer: Medicaid Other | Admitting: Obstetrics

## 2017-04-16 ENCOUNTER — Other Ambulatory Visit: Payer: Self-pay | Admitting: Obstetrics and Gynecology

## 2017-04-16 ENCOUNTER — Other Ambulatory Visit: Payer: Medicaid Other

## 2017-04-16 ENCOUNTER — Encounter: Payer: Medicaid Other | Admitting: Obstetrics and Gynecology

## 2017-04-17 ENCOUNTER — Other Ambulatory Visit: Payer: Medicaid Other

## 2017-04-17 ENCOUNTER — Encounter: Payer: Medicaid Other | Admitting: Obstetrics & Gynecology

## 2017-04-19 ENCOUNTER — Other Ambulatory Visit: Payer: Medicaid Other

## 2017-04-19 ENCOUNTER — Encounter: Payer: Medicaid Other | Admitting: Family Medicine

## 2017-04-20 ENCOUNTER — Telehealth (HOSPITAL_COMMUNITY): Payer: Self-pay | Admitting: *Deleted

## 2017-04-20 ENCOUNTER — Ambulatory Visit (HOSPITAL_COMMUNITY): Admission: RE | Admit: 2017-04-20 | Payer: Medicaid Other | Source: Ambulatory Visit

## 2017-04-20 ENCOUNTER — Other Ambulatory Visit: Payer: Medicaid Other

## 2017-04-20 NOTE — Telephone Encounter (Signed)
Preadmission screen  

## 2017-04-23 ENCOUNTER — Encounter (HOSPITAL_COMMUNITY): Payer: Self-pay

## 2017-04-23 ENCOUNTER — Other Ambulatory Visit: Payer: Medicaid Other

## 2017-04-23 ENCOUNTER — Encounter: Payer: Medicaid Other | Admitting: Obstetrics and Gynecology

## 2017-04-24 ENCOUNTER — Other Ambulatory Visit: Payer: Medicaid Other

## 2017-04-24 ENCOUNTER — Encounter: Payer: Medicaid Other | Admitting: Obstetrics and Gynecology

## 2017-04-26 ENCOUNTER — Other Ambulatory Visit: Payer: Medicaid Other

## 2017-04-27 ENCOUNTER — Encounter (HOSPITAL_COMMUNITY): Payer: Medicaid Other

## 2017-04-27 ENCOUNTER — Other Ambulatory Visit: Payer: Medicaid Other

## 2017-04-27 ENCOUNTER — Encounter (HOSPITAL_COMMUNITY)
Admission: RE | Admit: 2017-04-27 | Discharge: 2017-04-27 | Disposition: A | Discharge status: Home / Self Care | Payer: Medicaid Other | Source: Ambulatory Visit | Attending: Family Medicine | Admitting: Family Medicine

## 2017-04-27 DIAGNOSIS — I10 Essential (primary) hypertension: Secondary | ICD-10-CM | POA: Diagnosis not present

## 2017-04-27 DIAGNOSIS — N809 Endometriosis, unspecified: Secondary | ICD-10-CM | POA: Diagnosis not present

## 2017-04-27 DIAGNOSIS — D259 Leiomyoma of uterus, unspecified: Secondary | ICD-10-CM | POA: Diagnosis not present

## 2017-04-27 DIAGNOSIS — D649 Anemia, unspecified: Secondary | ICD-10-CM | POA: Insufficient documentation

## 2017-04-27 DIAGNOSIS — Z01812 Encounter for preprocedural laboratory examination: Secondary | ICD-10-CM | POA: Diagnosis not present

## 2017-04-27 LAB — CBC
HCT: 34 % — ABNORMAL LOW (ref 36.0–46.0)
Hemoglobin: 11.3 g/dL — ABNORMAL LOW (ref 12.0–15.0)
MCH: 26.7 pg (ref 26.0–34.0)
MCHC: 33.2 g/dL (ref 30.0–36.0)
MCV: 80.2 fL (ref 78.0–100.0)
PLATELETS: 228 10*3/uL (ref 150–400)
RBC: 4.24 MIL/uL (ref 3.87–5.11)
RDW: 15.6 % — ABNORMAL HIGH (ref 11.5–15.5)
WBC: 8.2 10*3/uL (ref 4.0–10.5)

## 2017-04-27 LAB — TYPE AND SCREEN
ABO/RH(D): O POS
Antibody Screen: NEGATIVE

## 2017-04-27 NOTE — Patient Instructions (Signed)
39 Wendy Floyd  04/27/2017   Your procedure is scheduled on:  04/30/2017  Enter through the Main Entrance of [facility] at [place] up the phone at the desk and dial [phone].   Call this number if you have problems the morning of surgery: [phone]   Remember:   Do not eat food:After Midnight.  Do not drink clear liquids: After Midnight.  Take these medicines the morning of surgery with A SIP OF WATER: none   Do not wear jewelry, make-up or nail polish.  Do not wear lotions, powders, or perfumes. Do not wear deodorant.  Do not shave 48 hours prior to surgery.  Do not bring valuables to the hospital.  [facility] is not   responsible for any belongings or valuables brought to the hospital.  Contacts, dentures or bridgework may not be worn into surgery.  Leave suitcase in the car. After surgery it may be brought to your room.  For patients admitted to the hospital, checkout time is 11:00 AM the day of              discharge.   Patients discharged the day of surgery will not be allowed to drive             home.  Name and phone number of your driver: na  Special Instructions:   N/A   Please read over the following fact sheets that you were given:   Surgical Site Infection Prevention

## 2017-04-28 LAB — RPR: RPR: NONREACTIVE

## 2017-04-30 ENCOUNTER — Encounter (HOSPITAL_COMMUNITY)
Admission: RE | Disposition: A | Discharge status: Home / Self Care | Payer: Self-pay | Source: Ambulatory Visit | Attending: Obstetrics and Gynecology

## 2017-04-30 ENCOUNTER — Inpatient Hospital Stay (HOSPITAL_COMMUNITY): Payer: Medicaid Other | Admitting: Anesthesiology

## 2017-04-30 ENCOUNTER — Inpatient Hospital Stay (HOSPITAL_COMMUNITY)
Admission: RE | Admit: 2017-04-30 | Discharge: 2017-05-02 | DRG: 787 | Disposition: A | Discharge status: Home / Self Care | Payer: Medicaid Other | Source: Ambulatory Visit | Attending: Obstetrics and Gynecology | Admitting: Obstetrics and Gynecology

## 2017-04-30 ENCOUNTER — Encounter (HOSPITAL_COMMUNITY): Payer: Self-pay | Admitting: Obstetrics and Gynecology

## 2017-04-30 DIAGNOSIS — Z98891 History of uterine scar from previous surgery: Secondary | ICD-10-CM

## 2017-04-30 DIAGNOSIS — O9902 Anemia complicating childbirth: Secondary | ICD-10-CM | POA: Diagnosis present

## 2017-04-30 DIAGNOSIS — Z87891 Personal history of nicotine dependence: Secondary | ICD-10-CM

## 2017-04-30 DIAGNOSIS — O0932 Supervision of pregnancy with insufficient antenatal care, second trimester: Secondary | ICD-10-CM

## 2017-04-30 DIAGNOSIS — O34211 Maternal care for low transverse scar from previous cesarean delivery: Secondary | ICD-10-CM | POA: Diagnosis present

## 2017-04-30 DIAGNOSIS — Z7982 Long term (current) use of aspirin: Secondary | ICD-10-CM

## 2017-04-30 DIAGNOSIS — O1002 Pre-existing essential hypertension complicating childbirth: Secondary | ICD-10-CM | POA: Diagnosis present

## 2017-04-30 DIAGNOSIS — O09523 Supervision of elderly multigravida, third trimester: Secondary | ICD-10-CM

## 2017-04-30 DIAGNOSIS — D649 Anemia, unspecified: Secondary | ICD-10-CM | POA: Diagnosis present

## 2017-04-30 DIAGNOSIS — Z3A39 39 weeks gestation of pregnancy: Secondary | ICD-10-CM | POA: Diagnosis not present

## 2017-04-30 DIAGNOSIS — O09529 Supervision of elderly multigravida, unspecified trimester: Secondary | ICD-10-CM

## 2017-04-30 DIAGNOSIS — O0990 Supervision of high risk pregnancy, unspecified, unspecified trimester: Secondary | ICD-10-CM

## 2017-04-30 DIAGNOSIS — O10919 Unspecified pre-existing hypertension complicating pregnancy, unspecified trimester: Secondary | ICD-10-CM | POA: Diagnosis present

## 2017-04-30 LAB — CREATININE, SERUM
CREATININE: 0.46 mg/dL (ref 0.44–1.00)
GFR calc Af Amer: >60 mL/min (ref >60)

## 2017-04-30 LAB — CBC
HCT: 31.7 % — ABNORMAL LOW (ref 36.0–46.0)
Hemoglobin: 10.5 g/dL — ABNORMAL LOW (ref 12.0–15.0)
MCH: 26.4 pg (ref 26.0–34.0)
MCHC: 33.1 g/dL (ref 30.0–36.0)
MCV: 79.8 fL (ref 78.0–100.0)
PLATELETS: 230 10*3/uL (ref 150–400)
RBC: 3.97 MIL/uL (ref 3.87–5.11)
RDW: 15.5 % (ref 11.5–15.5)
WBC: 12.4 10*3/uL — ABNORMAL HIGH (ref 4.0–10.5)

## 2017-04-30 SURGERY — Surgical Case
Anesthesia: Regional

## 2017-04-30 MED ORDER — FENTANYL CITRATE (PF) 100 MCG/2ML IJ SOLN
INTRAMUSCULAR | Status: AC
Start: 2017-04-30 — End: ?
  Filled 2017-04-30: qty 2

## 2017-04-30 MED ORDER — OXYTOCIN 40 UNITS IN LACTATED RINGERS INFUSION - SIMPLE MED: 2.5000 [IU]/h | INTRAVENOUS | Status: AC

## 2017-04-30 MED ORDER — FENTANYL CITRATE (PF) 100 MCG/2ML IJ SOLN
INTRAMUSCULAR | Status: AC
  Filled 2017-04-30: qty 2

## 2017-04-30 MED ORDER — ONDANSETRON HCL 4 MG/2ML IJ SOLN: 4.0000 mg | Freq: Three times a day (TID) | INTRAMUSCULAR | Status: DC | PRN

## 2017-04-30 MED ORDER — ACETAMINOPHEN 500 MG PO TABS
1000.0000 mg | ORAL_TABLET | Freq: Four times a day (QID) | ORAL | Status: AC
  Administered 2017-04-30 – 2017-05-01 (×4): 1000 mg via ORAL
  Filled 2017-04-30 (×4): qty 2

## 2017-04-30 MED ORDER — LACTATED RINGERS IV SOLN
INTRAVENOUS | Status: DC
  Administered 2017-04-30 – 2017-05-01 (×2): via INTRAVENOUS

## 2017-04-30 MED ORDER — SODIUM BICARBONATE 8.4 % IV SOLN
INTRAVENOUS | Status: DC | PRN
  Administered 2017-04-30 (×2): 5 mL via EPIDURAL
  Administered 2017-04-30: 4 mL via EPIDURAL
  Administered 2017-04-30 (×2): 5 mL via EPIDURAL
  Administered 2017-04-30: 1 mL via EPIDURAL

## 2017-04-30 MED ORDER — KETOROLAC TROMETHAMINE 30 MG/ML IJ SOLN
30.0000 mg | Freq: Four times a day (QID) | INTRAMUSCULAR | Status: AC | PRN
  Administered 2017-04-30: 30 mg via INTRAMUSCULAR

## 2017-04-30 MED ORDER — FENTANYL CITRATE (PF) 100 MCG/2ML IJ SOLN
INTRAMUSCULAR | Status: DC | PRN
  Administered 2017-04-30: 100 ug via EPIDURAL
  Administered 2017-04-30: 100 ug via INTRAVENOUS

## 2017-04-30 MED ORDER — SODIUM BICARBONATE 8.4 % IV SOLN
INTRAVENOUS | Status: AC
  Filled 2017-04-30: qty 50

## 2017-04-30 MED ORDER — SENNOSIDES-DOCUSATE SODIUM 8.6-50 MG PO TABS
2.0000 | ORAL_TABLET | ORAL | Status: DC
  Administered 2017-04-30 – 2017-05-01 (×2): 2 via ORAL
  Filled 2017-04-30 (×2): qty 2

## 2017-04-30 MED ORDER — ZOLPIDEM TARTRATE 5 MG PO TABS: 5.0000 mg | ORAL_TABLET | Freq: Every evening | ORAL | Status: DC | PRN

## 2017-04-30 MED ORDER — SCOPOLAMINE 1 MG/3DAYS TD PT72
1.0000 | MEDICATED_PATCH | Freq: Once | TRANSDERMAL | Status: DC
  Filled 2017-04-30: qty 1

## 2017-04-30 MED ORDER — METOCLOPRAMIDE HCL 5 MG/ML IJ SOLN: 10.0000 mg | Freq: Once | INTRAMUSCULAR | Status: DC | PRN

## 2017-04-30 MED ORDER — PHENYLEPHRINE 40 MCG/ML (10ML) SYRINGE FOR IV PUSH (FOR BLOOD PRESSURE SUPPORT)
PREFILLED_SYRINGE | INTRAVENOUS | Status: AC
  Filled 2017-04-30: qty 40

## 2017-04-30 MED ORDER — DIPHENHYDRAMINE HCL 25 MG PO CAPS
25.0000 mg | ORAL_CAPSULE | ORAL | Status: DC | PRN
  Administered 2017-04-30 – 2017-05-01 (×6): 25 mg via ORAL
  Filled 2017-04-30 (×6): qty 1

## 2017-04-30 MED ORDER — NALOXONE HCL 2 MG/2ML IJ SOSY
1.0000 ug/kg/h | PREFILLED_SYRINGE | INTRAMUSCULAR | Status: DC | PRN
  Filled 2017-04-30: qty 2

## 2017-04-30 MED ORDER — MORPHINE SULFATE (PF) 0.5 MG/ML IJ SOLN
INTRAMUSCULAR | Status: AC
  Filled 2017-04-30: qty 10

## 2017-04-30 MED ORDER — ONDANSETRON HCL 4 MG/2ML IJ SOLN
INTRAMUSCULAR | Status: AC
  Filled 2017-04-30: qty 2

## 2017-04-30 MED ORDER — LACTATED RINGERS IV SOLN: INTRAVENOUS | Status: DC

## 2017-04-30 MED ORDER — PHENYLEPHRINE 40 MCG/ML (10ML) SYRINGE FOR IV PUSH (FOR BLOOD PRESSURE SUPPORT)
PREFILLED_SYRINGE | INTRAVENOUS | Status: AC
  Filled 2017-04-30: qty 10

## 2017-04-30 MED ORDER — MORPHINE SULFATE (PF) 0.5 MG/ML IJ SOLN
INTRAMUSCULAR | Status: DC | PRN
  Administered 2017-04-30: 4 mg via EPIDURAL
  Administered 2017-04-30: 1 mg via INTRAVENOUS

## 2017-04-30 MED ORDER — OXYTOCIN 10 UNIT/ML IJ SOLN
INTRAMUSCULAR | Status: AC
  Filled 2017-04-30: qty 1

## 2017-04-30 MED ORDER — BUPIVACAINE HCL (PF) 0.5 % IJ SOLN
INTRAMUSCULAR | Status: AC
  Filled 2017-04-30: qty 30

## 2017-04-30 MED ORDER — MENTHOL 3 MG MT LOZG: 1.0000 | LOZENGE | OROMUCOSAL | Status: DC | PRN

## 2017-04-30 MED ORDER — IBUPROFEN 600 MG PO TABS
600.0000 mg | ORAL_TABLET | Freq: Four times a day (QID) | ORAL | Status: DC
  Administered 2017-05-01 – 2017-05-02 (×6): 600 mg via ORAL
  Filled 2017-04-30 (×6): qty 1

## 2017-04-30 MED ORDER — LIDOCAINE-EPINEPHRINE (PF) 2 %-1:200000 IJ SOLN
INTRAMUSCULAR | Status: AC
  Filled 2017-04-30: qty 40

## 2017-04-30 MED ORDER — LACTATED RINGERS IV SOLN
INTRAVENOUS | Status: DC | PRN
  Administered 2017-04-30: 10:00:00 via INTRAVENOUS

## 2017-04-30 MED ORDER — COCONUT OIL OIL
1.0000 "application " | TOPICAL_OIL | Status: DC | PRN
  Filled 2017-04-30: qty 120

## 2017-04-30 MED ORDER — KETOROLAC TROMETHAMINE 30 MG/ML IJ SOLN
INTRAMUSCULAR | Status: AC
  Administered 2017-04-30: 30 mg via INTRAMUSCULAR
  Filled 2017-04-30: qty 1

## 2017-04-30 MED ORDER — FENTANYL CITRATE (PF) 100 MCG/2ML IJ SOLN
INTRAMUSCULAR | Status: AC
  Administered 2017-04-30: 25 ug via INTRAVENOUS
  Filled 2017-04-30: qty 2

## 2017-04-30 MED ORDER — NALBUPHINE HCL 10 MG/ML IJ SOLN: 5.0000 mg | Freq: Once | INTRAMUSCULAR | Status: DC | PRN

## 2017-04-30 MED ORDER — NALOXONE HCL 0.4 MG/ML IJ SOLN: 0.4000 mg | INTRAMUSCULAR | Status: DC | PRN

## 2017-04-30 MED ORDER — FENTANYL CITRATE (PF) 100 MCG/2ML IJ SOLN
25.0000 ug | INTRAMUSCULAR | Status: DC | PRN
  Administered 2017-04-30 (×2): 25 ug via INTRAVENOUS

## 2017-04-30 MED ORDER — WITCH HAZEL-GLYCERIN EX PADS: 1.0000 "application " | MEDICATED_PAD | CUTANEOUS | Status: DC | PRN

## 2017-04-30 MED ORDER — OXYTOCIN 10 UNIT/ML IJ SOLN
INTRAVENOUS | Status: DC | PRN
  Administered 2017-04-30: 40 [IU] via INTRAVENOUS

## 2017-04-30 MED ORDER — OXYTOCIN 10 UNIT/ML IJ SOLN
INTRAMUSCULAR | Status: AC
  Filled 2017-04-30: qty 3

## 2017-04-30 MED ORDER — DIPHENHYDRAMINE HCL 50 MG/ML IJ SOLN
12.5000 mg | INTRAMUSCULAR | Status: DC | PRN
  Administered 2017-04-30: 12.5 mg via INTRAVENOUS
  Filled 2017-04-30 (×2): qty 1

## 2017-04-30 MED ORDER — NALBUPHINE HCL 10 MG/ML IJ SOLN
5.0000 mg | INTRAMUSCULAR | Status: DC | PRN
  Administered 2017-04-30: 5 mg via SUBCUTANEOUS
  Filled 2017-04-30: qty 1

## 2017-04-30 MED ORDER — DIBUCAINE 1 % RE OINT: 1.0000 "application " | TOPICAL_OINTMENT | RECTAL | Status: DC | PRN

## 2017-04-30 MED ORDER — PHENYLEPHRINE 8 MG IN D5W 100 ML (0.08MG/ML) PREMIX OPTIME: INJECTION | INTRAVENOUS | Status: DC | PRN

## 2017-04-30 MED ORDER — MEDROXYPROGESTERONE ACETATE 150 MG/ML IM SUSP: 150.0000 mg | INTRAMUSCULAR | Status: DC | PRN

## 2017-04-30 MED ORDER — ACETAMINOPHEN 325 MG PO TABS
650.0000 mg | ORAL_TABLET | ORAL | Status: DC | PRN
  Administered 2017-05-01: 650 mg via ORAL
  Filled 2017-04-30: qty 2

## 2017-04-30 MED ORDER — NALBUPHINE HCL 10 MG/ML IJ SOLN
5.0000 mg | INTRAMUSCULAR | Status: DC | PRN
  Administered 2017-04-30: 5 mg via INTRAVENOUS
  Filled 2017-04-30: qty 1

## 2017-04-30 MED ORDER — INFLUENZA VAC SPLIT QUAD 0.5 ML IM SUSY: 0.5000 mL | PREFILLED_SYRINGE | INTRAMUSCULAR | Status: DC

## 2017-04-30 MED ORDER — DIPHENHYDRAMINE HCL 25 MG PO CAPS: 25.0000 mg | ORAL_CAPSULE | Freq: Four times a day (QID) | ORAL | Status: DC | PRN

## 2017-04-30 MED ORDER — SODIUM CHLORIDE 0.9% FLUSH: 3.0000 mL | INTRAVENOUS | Status: DC | PRN

## 2017-04-30 MED ORDER — LACTATED RINGERS IV SOLN
INTRAVENOUS | Status: DC
  Administered 2017-04-30 (×3): via INTRAVENOUS

## 2017-04-30 MED ORDER — TETANUS-DIPHTH-ACELL PERTUSSIS 5-2.5-18.5 LF-MCG/0.5 IM SUSP: 0.5000 mL | Freq: Once | INTRAMUSCULAR | Status: DC

## 2017-04-30 MED ORDER — PRENATAL MULTIVITAMIN CH
1.0000 | ORAL_TABLET | Freq: Every day | ORAL | Status: DC
  Administered 2017-05-01 – 2017-05-02 (×2): 1 via ORAL
  Filled 2017-04-30 (×2): qty 1

## 2017-04-30 MED ORDER — OXYCODONE HCL 5 MG PO TABS
5.0000 mg | ORAL_TABLET | ORAL | Status: DC | PRN
  Administered 2017-05-01 (×2): 5 mg via ORAL
  Filled 2017-04-30 (×2): qty 1

## 2017-04-30 MED ORDER — PHENYLEPHRINE HCL 10 MG/ML IJ SOLN
INTRAMUSCULAR | Status: DC | PRN
  Administered 2017-04-30: 120 ug via INTRAVENOUS
  Administered 2017-04-30 (×4): 80 ug via INTRAVENOUS
  Administered 2017-04-30: 120 ug via INTRAVENOUS
  Administered 2017-04-30 (×11): 80 ug via INTRAVENOUS
  Administered 2017-04-30: 120 ug via INTRAVENOUS

## 2017-04-30 MED ORDER — PNEUMOCOCCAL VAC POLYVALENT 25 MCG/0.5ML IJ INJ
0.5000 mL | INJECTION | INTRAMUSCULAR | Status: DC
  Filled 2017-04-30: qty 0.5

## 2017-04-30 MED ORDER — SIMETHICONE 80 MG PO CHEW
80.0000 mg | CHEWABLE_TABLET | ORAL | Status: DC
  Administered 2017-04-30 – 2017-05-01 (×2): 80 mg via ORAL
  Filled 2017-04-30 (×2): qty 1

## 2017-04-30 MED ORDER — SIMETHICONE 80 MG PO CHEW: 80.0000 mg | CHEWABLE_TABLET | ORAL | Status: DC | PRN

## 2017-04-30 MED ORDER — SCOPOLAMINE 1 MG/3DAYS TD PT72
MEDICATED_PATCH | TRANSDERMAL | Status: AC
  Filled 2017-04-30: qty 1

## 2017-04-30 MED ORDER — CEFAZOLIN SODIUM-DEXTROSE 2-4 GM/100ML-% IV SOLN
2.0000 g | INTRAVENOUS | Status: AC
  Administered 2017-04-30: 2 g via INTRAVENOUS

## 2017-04-30 MED ORDER — MEPERIDINE HCL 25 MG/ML IJ SOLN: 6.2500 mg | INTRAMUSCULAR | Status: DC | PRN

## 2017-04-30 MED ORDER — SIMETHICONE 80 MG PO CHEW
80.0000 mg | CHEWABLE_TABLET | Freq: Three times a day (TID) | ORAL | Status: DC
  Administered 2017-05-01 – 2017-05-02 (×5): 80 mg via ORAL
  Filled 2017-04-30 (×5): qty 1

## 2017-04-30 MED ORDER — ENOXAPARIN SODIUM 40 MG/0.4ML ~~LOC~~ SOLN
40.0000 mg | SUBCUTANEOUS | Status: DC
  Administered 2017-05-01 – 2017-05-02 (×2): 40 mg via SUBCUTANEOUS
  Filled 2017-04-30 (×3): qty 0.4

## 2017-04-30 MED ORDER — ONDANSETRON HCL 4 MG/2ML IJ SOLN
INTRAMUSCULAR | Status: DC | PRN
  Administered 2017-04-30: 4 mg via INTRAVENOUS

## 2017-04-30 MED ORDER — OXYCODONE HCL 5 MG PO TABS
10.0000 mg | ORAL_TABLET | ORAL | Status: DC | PRN
  Administered 2017-04-30 – 2017-05-02 (×6): 10 mg via ORAL
  Filled 2017-04-30 (×6): qty 2

## 2017-04-30 MED ORDER — KETOROLAC TROMETHAMINE 30 MG/ML IJ SOLN
30.0000 mg | Freq: Four times a day (QID) | INTRAMUSCULAR | Status: AC | PRN
  Administered 2017-04-30: 30 mg via INTRAVENOUS
  Filled 2017-04-30: qty 1

## 2017-04-30 SURGICAL SUPPLY — 41 items
APL SKNCLS STERI-STRIP NONHPOA (GAUZE/BANDAGES/DRESSINGS) ×1
BENZOIN TINCTURE PRP APPL 2/3 (GAUZE/BANDAGES/DRESSINGS) ×3 IMPLANT
CHLORAPREP W/TINT 26ML (MISCELLANEOUS) ×3 IMPLANT
CLAMP CORD UMBIL (MISCELLANEOUS) IMPLANT
CLOSURE WOUND 1/2 X4 (GAUZE/BANDAGES/DRESSINGS) ×1
CLOTH BEACON ORANGE TIMEOUT ST (SAFETY) ×3 IMPLANT
DRSG OPSITE POSTOP 4X10 (GAUZE/BANDAGES/DRESSINGS) ×3 IMPLANT
ELECT REM PT RETURN 9FT ADLT (ELECTROSURGICAL) ×3
ELECTRODE REM PT RTRN 9FT ADLT (ELECTROSURGICAL) ×1 IMPLANT
EXTRACTOR VACUUM BELL STYLE (SUCTIONS) IMPLANT
GLOVE BIO SURGEON STRL SZ 6.5 (GLOVE) ×2 IMPLANT
GLOVE BIO SURGEONS STRL SZ 6.5 (GLOVE) ×1
GLOVE BIOGEL PI IND STRL 6.5 (GLOVE) ×1 IMPLANT
GLOVE BIOGEL PI IND STRL 7.0 (GLOVE) ×2 IMPLANT
GLOVE BIOGEL PI INDICATOR 6.5 (GLOVE) ×2
GLOVE BIOGEL PI INDICATOR 7.0 (GLOVE) ×4
GOWN STRL REUS W/TWL LRG LVL3 (GOWN DISPOSABLE) ×9 IMPLANT
HEMOSTAT SURGICEL 4X8 (HEMOSTASIS) ×2 IMPLANT
KIT ABG SYR 3ML LUER SLIP (SYRINGE) IMPLANT
NEEDLE HYPO 22GX1.5 SAFETY (NEEDLE) ×3 IMPLANT
NS IRRIG 1000ML POUR BTL (IV SOLUTION) ×3 IMPLANT
PACK C SECTION WH (CUSTOM PROCEDURE TRAY) ×3 IMPLANT
PAD ABD 7.5X8 STRL (GAUZE/BANDAGES/DRESSINGS) IMPLANT
PAD OB MATERNITY 4.3X12.25 (PERSONAL CARE ITEMS) ×3 IMPLANT
PENCIL SMOKE EVAC W/HOLSTER (ELECTROSURGICAL) ×3 IMPLANT
RTRCTR C-SECT PINK 25CM LRG (MISCELLANEOUS) IMPLANT
SPONGE GAUZE 4X4 12PLY STER LF (GAUZE/BANDAGES/DRESSINGS) IMPLANT
SPONGE LAP 18X18 X RAY DECT (DISPOSABLE) ×2 IMPLANT
STRIP CLOSURE SKIN 1/2X4 (GAUZE/BANDAGES/DRESSINGS) ×2 IMPLANT
SUT PLAIN 2 0 (SUTURE)
SUT PLAIN ABS 2-0 CT1 27XMFL (SUTURE) IMPLANT
SUT PROLENE 3 0 FS 2 (SUTURE) ×2 IMPLANT
SUT VIC AB 0 CT1 27 (SUTURE) ×6
SUT VIC AB 0 CT1 27XBRD ANBCTR (SUTURE) ×2 IMPLANT
SUT VIC AB 0 CT1 36 (SUTURE) ×6 IMPLANT
SUT VIC AB 0 CTX 36 (SUTURE) ×3
SUT VIC AB 0 CTX36XBRD ANBCTRL (SUTURE) IMPLANT
SUT VIC AB 2-0 CT1 (SUTURE) ×2 IMPLANT
SUT VIC AB 4-0 PS2 27 (SUTURE) ×3 IMPLANT
TOWEL OR 17X24 6PK STRL BLUE (TOWEL DISPOSABLE) ×3 IMPLANT
TRAY FOLEY BAG SILVER LF 14FR (SET/KITS/TRAYS/PACK) ×3 IMPLANT

## 2017-04-30 NOTE — Anesthesia Postprocedure Evaluation (Signed)
Anesthesia Post Note  Patient: Wendy Floyd  Procedure(s) Performed: REPEAT CESAREAN SECTION (N/A )     Patient location during evaluation: Mother Baby Anesthesia Type: Epidural Level of consciousness: awake and alert and oriented Pain management: satisfactory to patient Vital Signs Assessment: post-procedure vital signs reviewed and stable Respiratory status: respiratory function stable Cardiovascular status: stable Postop Assessment: no headache, no backache, epidural receding, patient able to bend at knees, no signs of nausea or vomiting and adequate PO intake Anesthetic complications: no    Last Vitals:  Vitals:   04/30/17 1647 04/30/17 1930  BP: 95/79 115/70  Pulse: (!) 105 60  Resp: 16 18  Temp:  (!) 36.4 C  SpO2:      Last Pain:  Vitals:   04/30/17 2210  TempSrc:   PainSc: 7    Pain Goal:                 [name]

## 2017-04-30 NOTE — Anesthesia Preprocedure Evaluation (Addendum)
Anesthesia Evaluation  Patient identified by MRN, date of birth, ID band Patient awake    Reviewed: Allergy & Precautions, NPO status , Patient's Chart, lab work & pertinent test results  Airway Mallampati: II  TM Distance: >3 FB Neck ROM: Full    Dental no notable dental hx.    Pulmonary neg pulmonary ROS, former smoker,    Pulmonary exam normal breath sounds clear to auscultation       Cardiovascular hypertension, negative cardio ROS Normal cardiovascular exam Rhythm:Regular Rate:Normal     Neuro/Psych negative neurological ROS  negative psych ROS   GI/Hepatic negative GI ROS, Neg liver ROS,   Endo/Other  negative endocrine ROS  Renal/GU negative Renal ROS  negative genitourinary   Musculoskeletal negative musculoskeletal ROS (+)   Abdominal   Peds negative pediatric ROS (+)  Hematology negative hematology ROS (+)   Anesthesia Other Findings Previous failed CSE x2. Straight to epidural this time.  Reproductive/Obstetrics (+) Pregnancy                            Anesthesia Physical Anesthesia Plan  ASA: II  Anesthesia Plan: Epidural   Post-op Pain Management:    Induction:   PONV Risk Score and Plan: 3 and Ondansetron and Treatment may vary due to age or medical condition  Airway Management Planned: Natural Airway  Additional Equipment:   Intra-op Plan:   Post-operative Plan:   Informed Consent: I have reviewed the patients History and Physical, chart, labs and discussed the procedure including the risks, benefits and alternatives for the proposed anesthesia with the patient or authorized representative who has indicated his/her understanding and acceptance.   Dental advisory given  Plan Discussed with: CRNA  Anesthesia Plan Comments:         Anesthesia Quick Evaluation

## 2017-04-30 NOTE — Progress Notes (Signed)
AD, Golda Acre and this RN at bedside to explain the UDS policy as it applies to MS. Gruenwald and her baby. Ms. Strong refuses UDS on her baby and requests written info on state law. Advised Ms.Kneisel to speak to the pediatrician in the am. Ms. Buckwalter notified that a Social Worker will visit her this hospital stay.

## 2017-04-30 NOTE — Transfer of Care (Signed)
Immediate Anesthesia Transfer of Care Note  Patient: Wendy Floyd  Procedure(s) Performed: REPEAT CESAREAN SECTION (N/A )  Patient Location: PACU  Anesthesia Type:Epidural  Level of Consciousness: awake, alert  and oriented  Airway & Oxygen Therapy: Patient Spontanous Breathing  Post-op Assessment: Report given to RN and Post -op Vital signs reviewed and stable  Post vital signs: Reviewed and stable  Last Vitals:  Vitals:   04/30/17 0826  BP: 130/90  Pulse: 71  Resp: 18  Temp: 36.7 C    Last Pain:  Vitals:   04/30/17 0826  TempSrc: Oral         Complications: No apparent anesthesia complications

## 2017-04-30 NOTE — Anesthesia Procedure Notes (Signed)
Epidural Patient location during procedure: OR  Staffing Anesthesiologist: Montez Hageman Performed: anesthesiologist   Preanesthetic Checklist Completed: patient identified, site marked, surgical consent, pre-op evaluation, timeout performed, IV checked, risks and benefits discussed and monitors and equipment checked  Epidural Patient position: sitting Prep: site prepped and draped and DuraPrep Patient monitoring: heart rate, continuous pulse ox and blood pressure Approach: right paramedian Location: L3-L4 Injection technique: LOR saline  Needle:  Needle type: Tuohy  Needle gauge: 17 G Needle length: 9 cm and 9 Needle insertion depth: 8 cm Catheter type: closed end flexible Catheter size: 20 Guage Catheter at skin depth: 12 cm Test dose: negative  Assessment Events: blood not aspirated, injection not painful, no injection resistance, negative IV test and no paresthesia

## 2017-04-30 NOTE — Op Note (Addendum)
[name] PROCEDURE DATE: 04/30/2017  PREOPERATIVE DIAGNOSES: Intrauterine pregnancy at [redacted]w[redacted]d weeks gestation; prior uterine incision x5  POSTOPERATIVE DIAGNOSES: The same  PROCEDURE: Repeat Mid Transverse Cesarean Section  SURGEON:  Verita Schneiders, MD  ASSISTANT:  Feliz Beam, MD  ANESTHESIOLOGY TEAM: Anesthesiologist: Montez Hageman, MD CRNA: Jonna Munro, CRNA  INDICATIONS: Wendy Floyd is a 36 y.o. [phone] at [redacted]w[redacted]d here for cesarean section secondary to the indications listed under preoperative diagnoses; please see preoperative note for further details.  The risks of cesarean section were discussed with the patient including but were not limited to: bleeding which may require transfusion or reoperation; infection which may require antibiotics; injury to bowel, bladder, ureters or other surrounding organs; injury to the fetus; need for additional procedures including hysterectomy in the event of a life-threatening hemorrhage; placental abnormalities wth subsequent pregnancies, incisional problems, thromboembolic phenomenon and other postoperative/anesthesia complications.   The patient concurred with the proposed plan, giving informed written consent for the procedure.    FINDINGS:  Viable female infant in cephalic presentation.  Apgars 9 and 9.  Clear amniotic fluid.  Intact placenta, three vessel cord.  Uterus with significant adhesions to omentum on right and anterior abdominal wall on left. Bladder densely tacked up over lower uterine segment. Ovaries palpated normally, left tube visualized and normal appearing, right tube not visualized secondary to adhesions.  ANESTHESIA: Spinal  INTRAVENOUS FLUIDS: 2500 ml   ESTIMATED BLOOD LOSS: 1000 ml URINE OUTPUT:  100 ml SPECIMENS: Placenta sent to L&D  COMPLICATIONS: None immediate  PROCEDURE IN DETAIL:  The patient preoperatively received intravenous antibiotics and had sequential compression devices applied to her  lower extremities.  She was then taken to the operating room where spinal anesthesia was administered and was found to be adequate. She was then placed in a dorsal supine position with a leftward tilt, and prepped and draped in a sterile manner.  A foley catheter was placed into her bladder with sterile technique and attached to constant gravity.  After a timeout was performed, a Pfannenstiel skin incision was made with scalpel over her preexisting scar and carried through to the underlying layer of fascia. The fascia was incised in the midline, and this incision was extended bilaterally using the Mayo scissors and bovie. Significant adhesions noted along the right aspect of the fascia with dense adhesions from fascia to rectus muscle. Kocher clamps were applied to the superior aspect of the fascial incision and the underlying rectus muscles were dissected off using the mayo scissors and bovie.  A similar process was carried out on the inferior aspect of the fascial incision. Once the rectus muscles were dissected off the fascia, the peritoneum was noted to be entered and significant adhesions noted from the uterus to the anterior abdominal wall. There were omental adhesions along the right aspect of the uterus. A dense adhesion was removed from the left side of the uterus and both rectus muscles were transected approximately 1 cm bilaterally. A mid transverse uterine incision was made with a scalpel secondary to the bladder being densely adhered to the lower uterine segment and extended bilaterally bluntly.  The infant was delivered from LOA position, thick meconium noted, nose and mouth were bulb suctioned, and the cord clamped and cut after 1 minute. The infant was then handed over to the waiting neonatology team. Uterine massage was then performed, and the placenta delivered intact with a three-vessel cord. The uterus was then cleared of clots and debris.  The hysterotomy  was closed with 0 Vicryl in a running  locked fashion. A second layer was placed with 0-Vicryl owing to the thickness of the uterine wall at the level of the hysterotomy. A figure of 8 suture was placed with 0-Vicryl in left aspect of the hysterotomy for some oozine and surgicel was placed over the entire hysterotomy for additional hemostasis. The fallopian tubes and ovaries were as noted in the findings.   The pelvis was cleared of all clot and debris. Hemostasis was confirmed on all surfaces. The fascia was then closed using 0 Vicryl in a running fashion.  The subcutaneous layer was irrigated, then reapproximated with 2-0 plain gut interrupted stitches, and 30 ml of 0.5% Marcaine was injected subcutaneously around the incision.  The skin was closed with a 4-0 Vicryl subcuticular stitch. The patient tolerated the procedure well. Sponge, lap, instrument and needle counts were correct x 3.  She was taken to the recovery room in stable condition.    Feliz Beam, M.D. Attending [place], [facility] for [employer], [place]

## 2017-04-30 NOTE — H&P (Signed)
Obstetric Preoperative History and Physical  Wendy Floyd is a 36 y.o. [id number] with IUP at [redacted]w[redacted]d presenting for scheduled repeat cesarean section.  No acute concerns.   Prenatal Course Source of Care: [facility] with onset of care at 22 weeks Pregnancy complications or risks: Patient Active Problem List   Diagnosis Date Noted  . Anemia affecting pregnancy in second trimester 01/05/2017  . Low serum potassium 01/05/2017  . Supervision of high risk pregnancy, antepartum 01/02/2017  . Late prenatal care affecting pregnancy in second trimester 01/02/2017  . Chronic hypertension during pregnancy, antepartum 01/02/2017  . Advanced maternal age in multigravida 01/02/2017  . Status post cesarean section 03/25/2015  . Smoker 09/24/2014   She plans to breastfeed, plans to bottle feed She desires Depo-Provera for postpartum contraception.   Prenatal labs and studies: ABO, Rh: --/--/O POS (09/28 1146) Antibody: NEG (09/28 1146) Rubella: 1.04 (06/05 1540) RPR: Non Reactive (09/28 1146)  HBsAg: Negative (06/05 1540)  HIV:   non-reactive GBS:   2 hr Glucola  normal Genetic screening normal Anatomy US normal  Prenatal Transfer Tool  Maternal Diabetes: No Genetic Screening: maternity 21 normal Maternal Ultrasounds/Referrals: Normal Fetal Ultrasounds or other Referrals:  None Maternal Substance Abuse:  No Significant Maternal Medications:  None Significant Maternal Lab Results: None  Past Medical History:  Diagnosis Date  . Anemia   . Endometriosis   . Fibroids   . Hypertension     Past Surgical History:  Procedure Laterality Date  . CESAREAN SECTION     4 c/s prior   . CESAREAN SECTION N/A 03/25/2015   Procedure: CESAREAN SECTION;  Surgeon: Truett Mainland, DO;  Location: [facility];  Service: Obstetrics;  Laterality: N/A;  Requested 03/25/15 @ 11:00a  . laporoscopy     to remove endometriosis    OB History  Gravida Para Term Preterm AB Living  6 5 5  0 0 5  SAB TAB Ectopic  Multiple Live Births  0 0 0 0 5    # Outcome Date GA Lbr Len/2nd Weight Sex Delivery Anes PTL Lv  6 Current           5 Term 03/25/15 [redacted]w[redacted]d  6 lb 8.4 oz (2.96 kg) F CS-LTranv EPI  LIV  4 Term 07/14/03 [redacted]w[redacted]d    CS-LTranv   LIV     Birth Comments: G[place]  3 Term 12/22/00 [redacted]w[redacted]d    CS-LTranv   LIV  2 Term 02/14/99 [redacted]w[redacted]d    CS-LTranv   LIV  1 Term 10/06/97 [redacted]w[redacted]d    CS-LTranv   LIV     Birth Comments: fetal distress      Social History   Social History  . Marital status: Legally Separated    Spouse name: N/A  . Number of children: N/A  . Years of education: N/A   Social History Main Topics  . Smoking status: Former Smoker    Packs/day: 0.50    Quit date: 10/28/2016  . Smokeless tobacco: Never Used  . Alcohol use No  . Drug use: Yes    Types: Marijuana     Comment: march 2018  . Sexual activity: Yes    Birth control/ protection: None     Comment: last intercourse Jan 18   Other Topics Concern  . Not on file   Social History Narrative  . No narrative on file    Family History  Problem Relation Age of Onset  . Diabetes Mother   . Hypertension Mother   .  Diabetes Father   . Hearing loss Paternal Grandmother     Prescriptions Prior to Admission  Medication Sig Dispense Refill Last Dose  . Prenat-Fe Poly-Methfol-FA-DHA (VITAFOL ULTRA) 29-0.6-0.4-200 MG CAPS Take 1 tablet by mouth daily. 30 capsule 12   . albuterol (PROVENTIL HFA;VENTOLIN HFA) 108 (90 BASE) MCG/ACT inhaler Inhale 2 puffs into the lungs every 6 (six) hours as needed for wheezing or shortness of breath. (Patient not taking: Reported on 04/25/2017) 1 Inhaler 0 Not Taking at Unknown time  . aspirin 81 MG chewable tablet Chew 1 tablet (81 mg total) by mouth daily. (Patient not taking: Reported on 03/05/2017) 30 tablet 12 Not Taking  . cephALEXin (KEFLEX) 500 MG capsule Take 1 capsule (500 mg total) by mouth 4 (four) times daily. (Patient not taking: Reported on 03/05/2017) 28 capsule 0 Not Taking  . Nutritional  Supplements (ENSURE HEALTHY MOM) LIQD Take 2-3 times daily (Patient not taking: Reported on 04/25/2017) 60 Bottle 0   . potassium chloride (K-DUR) 10 MEQ tablet Take 1 tablet (10 mEq total) by mouth daily. (Patient not taking: Reported on 03/05/2017) 15 tablet 0 Not Taking  . Prenatal-DSS-FeCb-FeGl-FA (CITRANATAL BLOOM) 90-1 MG TABS Take 1 tablet by mouth daily. (Patient not taking: Reported on 03/05/2017) 30 tablet 12 Not Taking  . terconazole (TERAZOL 3) 0.8 % vaginal cream Place 1 applicator vaginally at bedtime. (Patient not taking: Reported on 03/05/2017) 20 g 0 Not Taking    No Known Allergies  Review of Systems: Negative except for what is mentioned in HPI.  Physical Exam: BP 130/90   Pulse 71   Temp 98 F (36.7 C) (Oral)   Resp 18   LMP 07/30/2016  FHR by Doppler: 144 bpm CONSTITUTIONAL: Well-developed, well-nourished female in no acute distress.  HENT:  Normocephalic, atraumatic, External right and left ear normal. Oropharynx is clear and moist EYES: Conjunctivae and EOM are normal. Pupils are equal, round, and reactive to light. No scleral icterus.  NECK: Normal range of motion, supple, no masses SKIN: Skin is warm and dry. No rash noted. Not diaphoretic. No erythema. No pallor. [place]: Alert and oriented to person, place, and time. Normal reflexes, muscle tone coordination. No cranial nerve deficit noted. PSYCHIATRIC: Normal mood and affect. Normal behavior. Normal judgment and thought content. CARDIOVASCULAR: Normal effort RESPIRATORY: Effort normal, no problems with respiration noted ABDOMEN: Soft, nontender, nondistended, gravid. Well-healed Pfannenstiel incision. PELVIC: Deferred MUSCULOSKELETAL: Normal range of motion. No edema and no tenderness   Pertinent Labs/Studies:   Results for orders placed or performed during the hospital encounter of 04/27/17 (from the past 72 hour(s))  CBC     Status: Abnormal   Collection Time: 04/27/17 11:46 AM  Result Value Ref Range    WBC 8.2 4.0 - 10.5 K/uL   RBC 4.24 3.87 - 5.11 MIL/uL   Hemoglobin 11.3 (L) 12.0 - 15.0 g/dL   HCT 34.0 (L) 36.0 - 46.0 %   MCV 80.2 78.0 - 100.0 fL   MCH 26.7 26.0 - 34.0 pg   MCHC 33.2 30.0 - 36.0 g/dL   RDW 15.6 (H) 11.5 - 15.5 %   Platelets 228 150 - 400 K/uL  RPR     Status: None   Collection Time: 04/27/17 11:46 AM  Result Value Ref Range   RPR Ser Ql Non Reactive Non Reactive    Comment: (NOTE) Performed At: [facility] [address] [place], [place] [id number] Lindon Romp MD VH:[id number]   Type and screen     Status:  None   Collection Time: 04/27/17 11:46 AM  Result Value Ref Range   ABO/RH(D) O POS    Antibody Screen NEG    Sample Expiration 04/30/2017     Assessment and Plan :CAHTERINE HEINZEL is a 36 y.o. [id number] at [redacted]w[redacted]d being admitted for scheduled cesarean section. The risks of cesarean section discussed with the patient included but were not limited to: bleeding which may require transfusion or reoperation; infection which may require antibiotics; injury to bowel, bladder, ureters or other surrounding organs; injury to the fetus; need for additional procedures including hysterectomy in the event of a life-threatening hemorrhage; placental abnormalities wth subsequent pregnancies, incisional problems, thromboembolic phenomenon and other postoperative/anesthesia complications. The patient concurred with the proposed plan, giving informed written consent for the procedure. Patient has been NPO since last night she will remain NPO for procedure. Anesthesia and OR aware. Preoperative prophylactic antibiotics and SCDs ordered on call to the OR. To OR when ready.    Feliz Beam, M.D. Attending [place], [facility] for [employer], [place]

## 2017-05-01 LAB — CBC
HCT: 28.5 % — ABNORMAL LOW (ref 36.0–46.0)
Hemoglobin: 9.6 g/dL — ABNORMAL LOW (ref 12.0–15.0)
MCH: 26.8 pg (ref 26.0–34.0)
MCHC: 33.7 g/dL (ref 30.0–36.0)
MCV: 79.6 fL (ref 78.0–100.0)
PLATELETS: 198 10*3/uL (ref 150–400)
RBC: 3.58 MIL/uL — ABNORMAL LOW (ref 3.87–5.11)
RDW: 15.5 % (ref 11.5–15.5)
WBC: 10 10*3/uL (ref 4.0–10.5)

## 2017-05-01 LAB — BIRTH TISSUE RECOVERY COLLECTION (PLACENTA DONATION)

## 2017-05-01 MED ORDER — LIDOCAINE 5 % EX PTCH
1.0000 | MEDICATED_PATCH | CUTANEOUS | Status: DC
  Administered 2017-05-01 – 2017-05-02 (×2): 1 via TRANSDERMAL
  Filled 2017-05-01 (×3): qty 1

## 2017-05-01 NOTE — Progress Notes (Signed)
CSW acknowledges consult and attempted to meet with MOB to complete a clinical assessment for limited [facility] and substance use during pregnancy.  When CSW arrived, MOB was attaching and bonding with infant as evidence by MOB engaging in breastfeeding.  CSW explained CSW's role and attempted to initiate the clinical assessment.  MOB was not compliant and refused to answer CSW's questions. MOB expressed "I had adequate PNC and somebody needs to fix my chart." CSW was sensitive to MOB's concerns and attempted to process with MOB the need for CSW to meet with MOB.  MOB appeared hostile as evidence by MOB's increase voice tone/volume, and the rolling of MOB's eyes.  MOB informed CSW that MOB will not answer any of CSW's questions.   CSW left MOB's room and updated bedside nurse and Teaching Service.   CSW left a message with [place] Nephew) to make a CPS report for Substance Exposed Infant. At this time, there are barriers to d/c until CSW makes a CPS report.  Laurey Arrow, MSW, LCSW Clinical Social Work [phone]

## 2017-05-01 NOTE — Progress Notes (Signed)
Pt acting noncompliant throughout night. Pt became verbally upset when RN educated pt on gas build up when drinking soda and using straws after surgery, demanding the drink anyways. Pt was intolerant of IV machine beeping due to infusion complete and repeatedly called out in a minute for it to be stopped. Pt refused for RN to remove foley overnight, requesting it to be done at exactly 0600 in the morning. Pt requested an additional TcB to be tested on infant prior to baby being stuck with lab for TsB (TcB reading in the 75 percentile). RN tried to educate MOB on various policies, which pt refuted.

## 2017-05-01 NOTE — Progress Notes (Signed)
Pt requested that I call the MD regarding her bladder. I reported this information to Dr Elwanda Brooklyn. Ms Cragg states she is emptying her bladder, slight burning but the bladder has pain. We discussed bladder spasms and voiding every 2 hours. Ms Paugh stated a physician said her bladder would be checked during this hospital stay. Ms Casimir is requesting to talk to a doctor. Dr Elwanda Brooklyn was notified of pt's request.

## 2017-05-01 NOTE — Progress Notes (Signed)
Post-Op Day 1  Subjective:  Wendy Floyd is a 36 y.o. [id number] [redacted]w[redacted]d s/p rLTCS.  No acute events overnight.  Pt denies problems with ambulating, or po intake.  She denies nausea or vomiting.  Pain is well controlled.  She has not had flatus. She has not had bowel movement.  Lochia Small.  Plan for birth control is Depo-Provera.  Method of Feeding: Breast and Bottle.  Objective: BP 104/62 (BP Location: Left Arm)   Pulse 61   Temp 97.8 F (36.6 C) (Oral)   Resp 18   LMP 07/30/2016   SpO2 100%   Physical Exam:  General: alert, cooperative and no distress Lochia:normal flow Chest: normal work of breathing Heart: regular rate Abdomen: soft, nontender Uterine Fundus: firm Incision: Dressing  Incision: Dressing is clean, dry, and intact DVT Evaluation: No evidence of DVT seen on physical exam   Recent Labs  04/30/17 1319 05/01/17 0443  HGB 10.5* 9.6*  HCT 31.7* 28.5*    Assessment/Plan:  ASSESSMENT: Wendy Floyd is a 36 y.o. G6P5005 [redacted]w[redacted]d pod #1 s/p rLTCS doing well.   Plan for discharge tomorrow and Lactation consult Remove foley catheter Continue routine postpartum care   LOS: 1 day   Luiz Blare, DO 05/01/2017, 6:38 AM

## 2017-05-01 NOTE — Lactation Note (Signed)
This note was copied from a baby's chart. Lactation Consultation Note  Patient Name: Wendy Floyd UUVOZ'D Date: 05/01/2017 Reason for consult: Follow-up assessment;Initial assessment;Term;Other (Comment) (P6 )  Baby is 53 hours old, so far the baby has been exclusively breast fed.  LC changed a large wet diaper, and placed the baby STS.  LC assisted mom to latch in the football position, @ 1st  Rubbing noise and dimpling in the cheek Noted. LC adjusted the depth and positioning and the dimpling disappeared, and the rubbing noise  Was a lot less. Baby was still feeding at 20 mins.  Per mom feeding latch is comfortable.  Mother informed of post-discharge support and given phone number to the lactation department, including services for phone call assistance; out-patient appointments; and breastfeeding support group. List of other breastfeeding resources in the community given in the handout. Encouraged mother to call for problems or concerns related to breastfeeding.   Maternal Data Has patient been taught Hand Expression?: Yes (steady flow of colostrum when expressing ) Does the patient have breastfeeding experience prior to this delivery?: Yes  Feeding Feeding Type: Breast Fed Length of feed:  (still feeding at 20 mins)  LATCH Score Latch: Grasps breast easily, tongue down, lips flanged, rhythmical sucking.  Audible Swallowing: Spontaneous and intermittent  Type of Nipple: Everted at rest and after stimulation  Comfort (Breast/Nipple): Soft / non-tender  Hold (Positioning): Assistance needed to correctly position infant at breast and maintain latch.  LATCH Score: 9  Interventions Interventions: Breast feeding basics reviewed;Assisted with latch;Skin to skin;Breast massage;Hand express;Breast compression;Adjust position;Support pillows;Position options  Lactation Tools Discussed/Used     Consult Status Consult Status: Follow-up Date: 05/02/17 Follow-up type:  In-patient    [place] 05/01/2017, 1:33 PM

## 2017-05-02 ENCOUNTER — Encounter (HOSPITAL_COMMUNITY): Payer: Self-pay | Admitting: *Deleted

## 2017-05-02 LAB — URINALYSIS, ROUTINE W REFLEX MICROSCOPIC
BILIRUBIN URINE: NEGATIVE
Bacteria, UA: NONE SEEN
GLUCOSE, UA: NEGATIVE mg/dL
KETONES UR: NEGATIVE mg/dL
Leukocytes, UA: NEGATIVE
NITRITE: NEGATIVE
PH: 7 (ref 5.0–8.0)
Protein, ur: NEGATIVE mg/dL
SPECIFIC GRAVITY, URINE: 1.006 (ref 1.005–1.030)

## 2017-05-02 MED ORDER — IBUPROFEN 600 MG PO TABS: 600.0000 mg | ORAL_TABLET | Freq: Four times a day (QID) | ORAL | 0 refills | Status: AC

## 2017-05-02 MED ORDER — OXYCODONE HCL 5 MG PO TABS: 5.0000 mg | ORAL_TABLET | Freq: Four times a day (QID) | ORAL | 0 refills | Status: AC | PRN

## 2017-05-02 MED ORDER — ONDANSETRON HCL 4 MG PO TABS
4.0000 mg | ORAL_TABLET | Freq: Four times a day (QID) | ORAL | Status: DC | PRN
  Administered 2017-05-02: 4 mg via ORAL
  Filled 2017-05-02: qty 1

## 2017-05-02 MED ORDER — PHENAZOPYRIDINE HCL 200 MG PO TABS
200.0000 mg | ORAL_TABLET | Freq: Three times a day (TID) | ORAL | Status: DC
  Administered 2017-05-02: 200 mg via ORAL
  Filled 2017-05-02 (×6): qty 1

## 2017-05-02 NOTE — Progress Notes (Signed)
CSW made a report to [place], Wendall Stade.  At this time there are barriers to d/c until CPS establish a disposition plan.   Laurey Arrow, MSW, LCSW Clinical Social Work [phone]

## 2017-05-02 NOTE — Discharge Instructions (Signed)

## 2017-05-02 NOTE — Discharge Summary (Signed)
OB Discharge Summary     Patient Name: Wendy Floyd DOB: 12/10/1980 MRN: [id number]  Date of admission: 04/30/2017 Delivering MD: Sloan Leiter   Date of discharge: 05/02/2017  Admitting diagnosis: RCS Intrauterine pregnancy: [redacted]w[redacted]d     Secondary diagnosis:  Active Problems:   Status post cesarean section   Supervision of high risk pregnancy, antepartum   Late prenatal care affecting pregnancy in second trimester   Chronic hypertension during pregnancy, antepartum   Advanced maternal age in multigravida   Supervision of high-risk pregnancy of elderly multigravida (>= 13 years old at time of delivery), third trimester     Discharge diagnosis: Term Pregnancy Delivered                                                                                                Post partum procedures:none  Augmentation: none  Complications: None  Hospital course:  Sceduled C/S   36 y.o. yo [id number] at [redacted]w[redacted]d was admitted to the hospital 04/30/2017 for scheduled cesarean section with the following indication: Repeat (hx of 5 prior C/S) Membrane Rupture Time/Date: 10:07 AM ,04/30/2017   Patient delivered a Viable infant.04/30/2017  Details of operation can be found in separate operative note.  Pateint had an uncomplicated postpartum course.  She is ambulating, tolerating a regular diet, passing flatus, and urinating well. Patient is discharged in stable condition on  05/02/17         Physical exam  Vitals:   05/01/17 0845 05/01/17 1210 05/01/17 1908 05/02/17 0542  BP: (!) 98/54 118/62 102/68 110/67  Pulse: (!) 53 (!) 58 69 (!) 55  Resp: 18 18 18 16   Temp: 97.7 F (36.5 C) 98 F (36.7 C) 98.3 F (36.8 C) 98.1 F (36.7 C)  TempSrc: Oral  Oral Oral  SpO2: 98% 98%     General: alert and no distress Lochia: appropriate Uterine Fundus: firm Incision: Healing well with no significant drainage DVT Evaluation: No evidence of DVT seen on physical exam. No significant calf/ankle  edema. Labs: Lab Results  Component Value Date   WBC 10.0 05/01/2017   HGB 9.6 (L) 05/01/2017   HCT 28.5 (L) 05/01/2017   MCV 79.6 05/01/2017   PLT 198 05/01/2017   CMP Latest Ref Rng & Units 04/30/2017  Glucose 65 - 99 mg/dL -  BUN 6 - 20 mg/dL -  Creatinine 0.44 - 1.00 mg/dL 0.46  Sodium 134 - 144 mmol/L -  Potassium 3.5 - 5.2 mmol/L -  Chloride 96 - 106 mmol/L -  CO2 18 - 29 mmol/L -  Calcium 8.7 - 10.2 mg/dL -  Total Protein 6.0 - 8.5 g/dL -  Total Bilirubin 0.0 - 1.2 mg/dL -  Alkaline Phos 39 - 117 IU/L -  AST 0 - 40 IU/L -  ALT 0 - 32 IU/L -    Discharge instruction: per After Visit Summary and "Baby and Me Booklet".  After visit meds:  Allergies as of 05/02/2017   No Known Allergies     Medication List    STOP taking these medications   aspirin 81 MG chewable tablet  cephALEXin 500 MG capsule Commonly known as:  KEFLEX   CITRANATAL BLOOM 90-1 MG Tabs   ENSURE HEALTHY MOM Liqd   potassium chloride 10 MEQ tablet Commonly known as:  K-DUR   terconazole 0.8 % vaginal cream Commonly known as:  TERAZOL 3     TAKE these medications   albuterol 108 (90 Base) MCG/ACT inhaler Commonly known as:  PROVENTIL HFA;VENTOLIN HFA Inhale 2 puffs into the lungs every 6 (six) hours as needed for wheezing or shortness of breath.   ibuprofen 600 MG tablet Commonly known as:  ADVIL,MOTRIN Take 1 tablet (600 mg total) by mouth every 6 (six) hours.   oxyCODONE 5 MG immediate release tablet Commonly known as:  Oxy IR/ROXICODONE Take 1 tablet (5 mg total) by mouth every 6 (six) hours as needed for severe pain.   VITAFOL ULTRA 29-0.6-0.4-200 MG Caps Take 1 tablet by mouth daily.       Diet: routine diet  Activity: Advance as tolerated. Pelvic rest for 6 weeks.   Follow up Appt:Future Appointments Date Time Provider [place]  05/24/2017 1:00 PM Kandis Cocking A, CNM [facility] None    Postpartum contraception: Depo Provera  Newborn Data: Live born  female  Birth Weight: 6 lb 6.3 oz (2900 g) APGAR: 49, 9  Newborn Delivery   Birth date/time:  04/30/2017 10:08:00 Delivery type:  C-Section, Low Transverse  C-section categorization:  Repeat     Baby Feeding: Bottle Disposition: baby not yet discharged, but mother wanted to be discharged before baby or would leaved AMA. CSW had not yet cleared mother for discharge. Prior to patient leaving AMA, police arrived and took patient into custody.   05/02/2017 Gailen Shelter, MD

## 2017-05-02 NOTE — Progress Notes (Signed)
Patient refused to be checked but did allow [place] to check incision and incision

## 2017-05-02 NOTE — Progress Notes (Signed)
Subjective: Postpartum Day 2: Cesarean Delivery Patient reports incisional pain and + flatus. Having some sharp pains with urinating. No burning. Some right leg pain from hip down leg, but no swelling or erythema.  Objective: Vital signs in last 24 hours: Temp:  [97.7 F (36.5 C)-98.3 F (36.8 C)] 98.1 F (36.7 C) (10/03 0542) Pulse Rate:  [53-69] 55 (10/03 0542) Resp:  [16-18] 16 (10/03 0542) BP: (98-118)/(54-68) 110/67 (10/03 0542) SpO2:  [98 %] 98 % (10/02 1210)  Physical Exam:  General: alert, cooperative and no distress Lochia: appropriate Uterine Fundus: firm Incision: healing well, no significant drainage, no dehiscence, no significant erythema DVT Evaluation: No evidence of DVT seen on physical exam. Negative Homan's sign. No cords or calf tenderness.   Recent Labs  04/30/17 1319 05/01/17 0443  HGB 10.5* 9.6*  HCT 31.7* 28.5*    Assessment/Plan: Status post Cesarean section. Doing well postoperatively.  Continue current care. UA Pyridium Low concern for DVT  Truett Mainland 05/02/2017, 7:33 AM

## 2017-05-02 NOTE — Progress Notes (Signed)
Subjective: Postpartum Day 2: Cesarean Delivery Patient reports incisional pain and + flatus.  Patient is reporting some RLQ pain relieved with a lidocaine patch and 10mg  Oxy. Patient is still having some discomfort on voiding, states it feels like "its inside her bladder". Not burning.  Objective: Vital signs in last 24 hours: Temp:  [98 F (36.7 C)-98.3 F (36.8 C)] 98.1 F (36.7 C) (10/03 0542) Pulse Rate:  [55-69] 55 (10/03 0542) Resp:  [16-18] 16 (10/03 0542) BP: (102-118)/(62-68) 110/67 (10/03 0542) SpO2:  [98 %] 98 % (10/02 1210)  Physical Exam:  General: alert, cooperative and no distress Lochia: appropriate Uterine Fundus: firm Incision: healing well, no significant drainage, no dehiscence DVT Evaluation: No evidence of DVT seen on physical exam. No cords or calf tenderness.   Recent Labs  04/30/17 1319 05/01/17 0443  HGB 10.5* 9.6*  HCT 31.7* 28.5*    Assessment/Plan: Status post Cesarean section. Doing well postoperatively. Patient would like to stay today. Plan on D/C tomorrow. Continue current care.  Timothy Lockamy 05/02/2017, 9:02 AM   OB FELLOW POSTPARTUM PROGRESS NOTE ATTESTATION  I have seen and examined this patient and agree with above documentation in the resident's note.   Gailen Shelter, MD OB Fellow

## 2017-05-09 ENCOUNTER — Other Ambulatory Visit (HOSPITAL_COMMUNITY): Payer: Self-pay | Admitting: *Deleted

## 2017-05-14 ENCOUNTER — Other Ambulatory Visit: Payer: Self-pay

## 2017-05-14 DIAGNOSIS — Z30013 Encounter for initial prescription of injectable contraceptive: Secondary | ICD-10-CM

## 2017-05-14 MED ORDER — MEDROXYPROGESTERONE ACETATE 150 MG/ML IM SUSP: 150.0000 mg | INTRAMUSCULAR | 4 refills | Status: AC

## 2017-05-14 NOTE — Progress Notes (Signed)
DEPO RX sent to [facility] [address] [address].

## 2017-05-24 ENCOUNTER — Ambulatory Visit: Payer: Medicaid Other | Admitting: Certified Nurse Midwife

## 2017-11-09 ENCOUNTER — Telehealth: Payer: Self-pay

## 2017-11-09 NOTE — Telephone Encounter (Signed)
Patient is requesting RX for BV and UTI.

## 2017-11-11 ENCOUNTER — Other Ambulatory Visit: Payer: Self-pay | Admitting: Obstetrics

## 2017-11-11 DIAGNOSIS — B9689 Other specified bacterial agents as the cause of diseases classified elsewhere: Secondary | ICD-10-CM

## 2017-11-11 DIAGNOSIS — N3000 Acute cystitis without hematuria: Secondary | ICD-10-CM

## 2017-11-11 DIAGNOSIS — N760 Acute vaginitis: Secondary | ICD-10-CM

## 2017-11-11 MED ORDER — NITROFURANTOIN MONOHYD MACRO 100 MG PO CAPS: 100.0000 mg | ORAL_CAPSULE | Freq: Two times a day (BID) | ORAL | 0 refills | Status: AC

## 2017-11-11 MED ORDER — SECNIDAZOLE 2 G PO PACK: 1.0000 | PACK | Freq: Once | ORAL | 2 refills | Status: AC

## 2017-11-19 ENCOUNTER — Other Ambulatory Visit: Payer: Self-pay | Admitting: *Deleted

## 2017-11-19 DIAGNOSIS — B9689 Other specified bacterial agents as the cause of diseases classified elsewhere: Secondary | ICD-10-CM

## 2017-11-19 DIAGNOSIS — N760 Acute vaginitis: Principal | ICD-10-CM

## 2017-11-19 MED ORDER — METRONIDAZOLE 500 MG PO TABS: 500.0000 mg | ORAL_TABLET | Freq: Two times a day (BID) | ORAL | 0 refills | Status: AC

## 2017-11-19 NOTE — Progress Notes (Signed)
Pt called to office with symptoms of BV.  Treatment sent per protocol. Pt made aware she also has Rx that was sent in for UTI.

## 2018-02-27 IMAGING — DX DG ANKLE COMPLETE 3+V*L*
3 series · 3 of 3 positions shown · non-contrast
Comparison: None.

CLINICAL DATA: Left ankle pain and swelling following a fall last
night, falling down 5 marble stairs.

EXAM:
LEFT ANKLE COMPLETE - 3+ VIEW

[ankle ap]
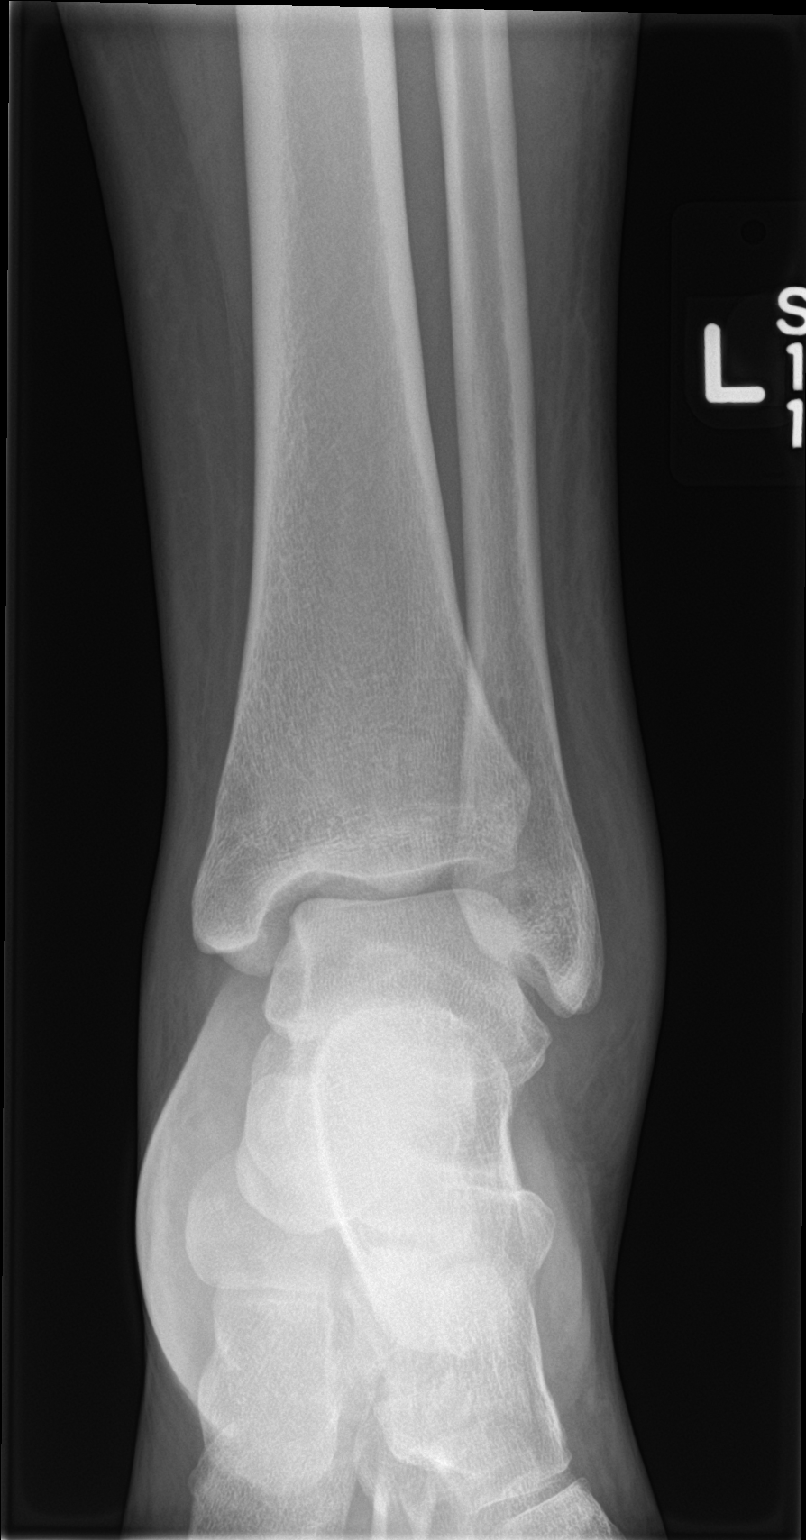

[ankle obl]
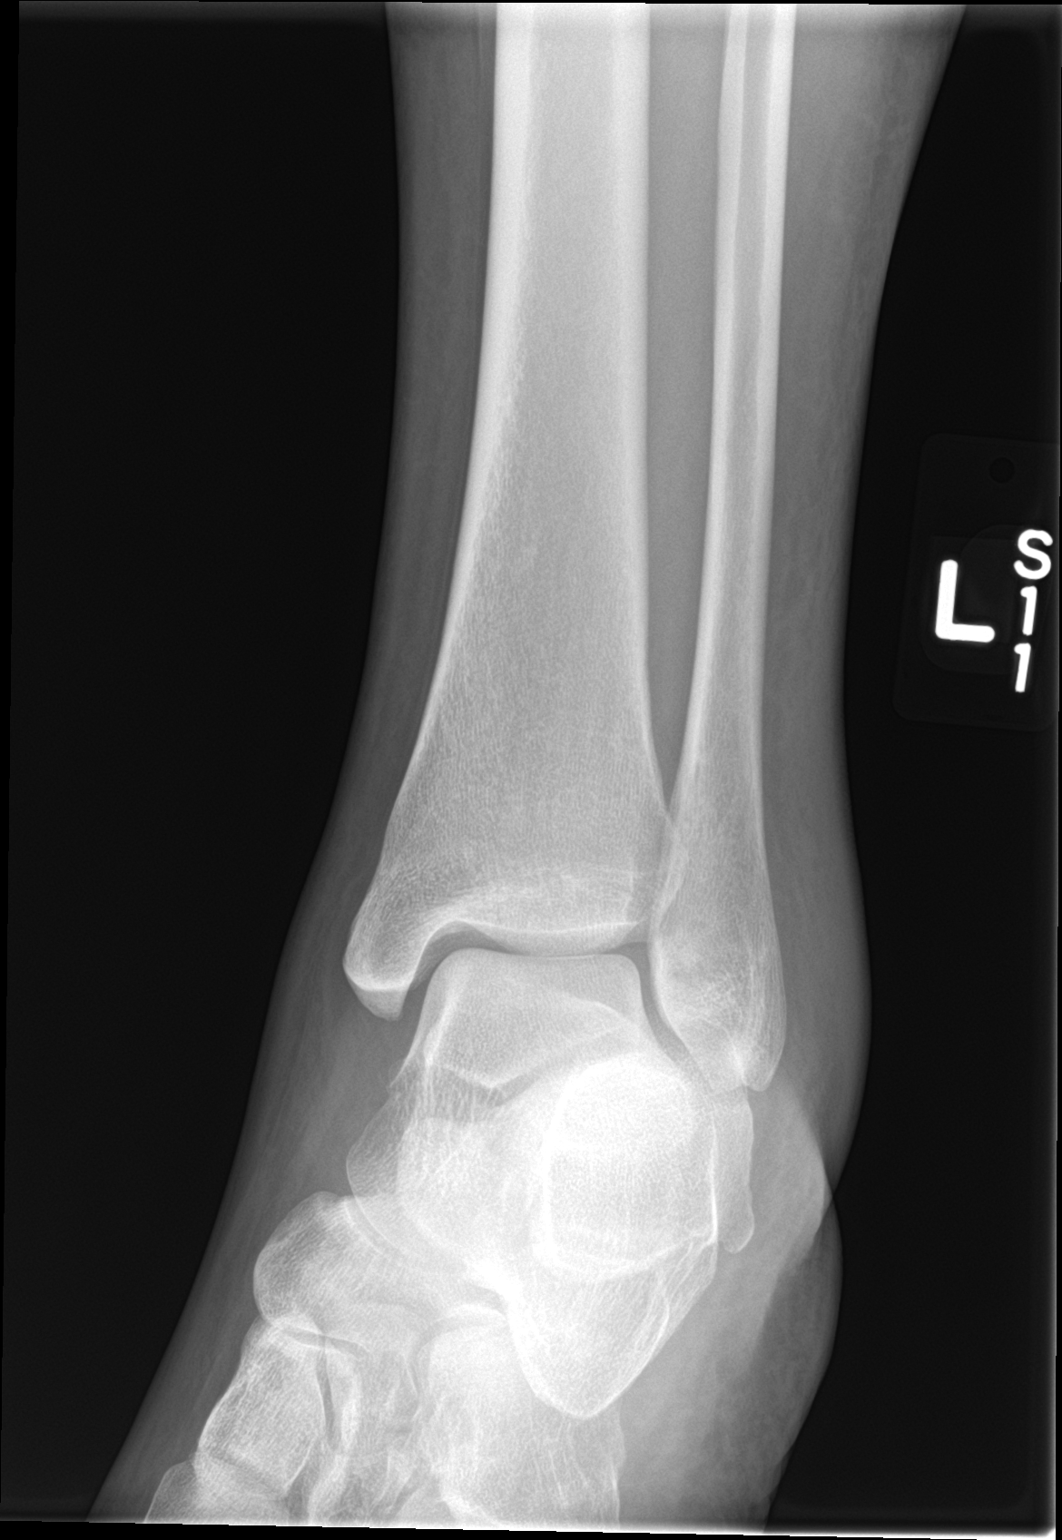

[ankle lat]
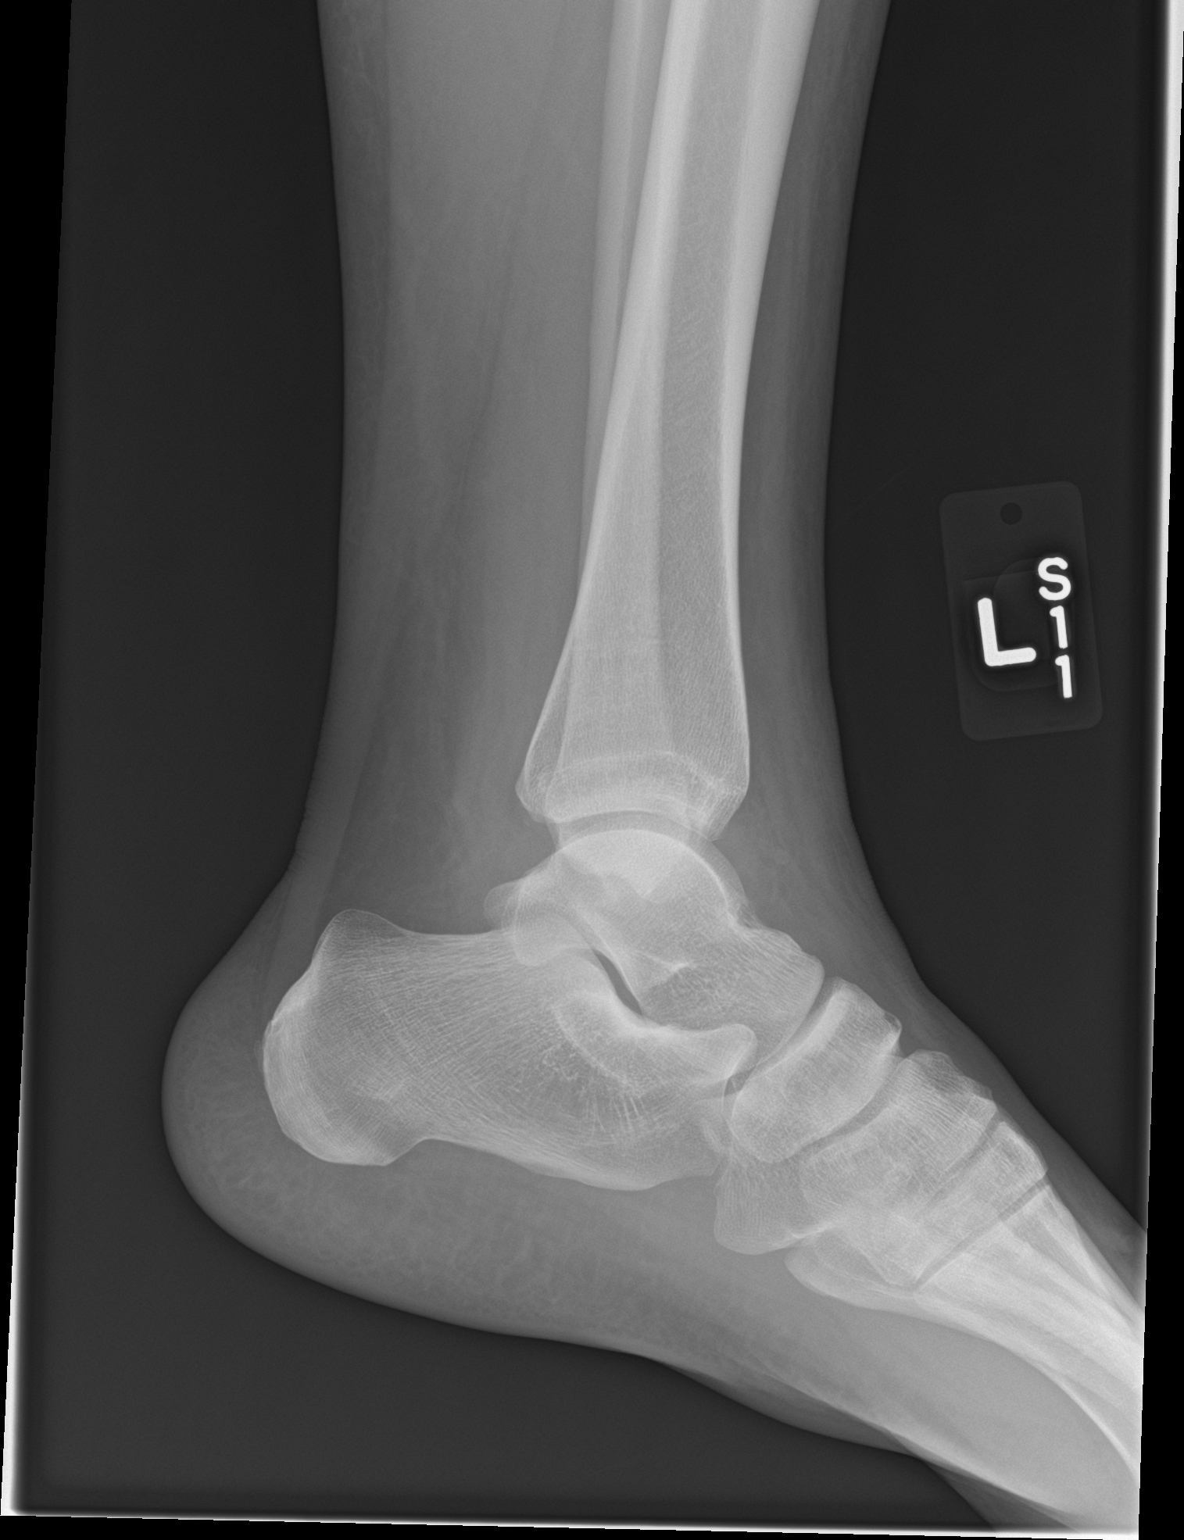

[3 of 3 positions shown; findings below may reference images not displayed]

FINDINGS: Diffuse soft tissue swelling, most pronounced laterally. Probable
effusion. No fracture or dislocation seen.
IMPRESSION: No fracture.  Probable effusion.

## 2018-04-25 ENCOUNTER — Telehealth: Payer: Self-pay

## 2018-04-25 NOTE — Telephone Encounter (Signed)
Returned call and pt stated that she figured out the issue with her rx cost.

## 2018-05-02 ENCOUNTER — Ambulatory Visit: Payer: Medicaid Other | Admitting: Certified Nurse Midwife

## 2018-05-09 ENCOUNTER — Emergency Department (HOSPITAL_COMMUNITY): Payer: Medicaid Other

## 2018-05-09 ENCOUNTER — Emergency Department (HOSPITAL_COMMUNITY)
Admission: EM | Admit: 2018-05-09 | Discharge: 2018-05-09 | Disposition: A | Discharge status: Home / Self Care | Payer: Medicaid Other | Attending: Emergency Medicine | Admitting: Emergency Medicine

## 2018-05-09 DIAGNOSIS — Z87891 Personal history of nicotine dependence: Secondary | ICD-10-CM | POA: Insufficient documentation

## 2018-05-09 DIAGNOSIS — Y939 Activity, unspecified: Secondary | ICD-10-CM | POA: Insufficient documentation

## 2018-05-09 DIAGNOSIS — Y929 Unspecified place or not applicable: Secondary | ICD-10-CM | POA: Insufficient documentation

## 2018-05-09 DIAGNOSIS — Y999 Unspecified external cause status: Secondary | ICD-10-CM | POA: Insufficient documentation

## 2018-05-09 DIAGNOSIS — S93402A Sprain of unspecified ligament of left ankle, initial encounter: Secondary | ICD-10-CM | POA: Insufficient documentation

## 2018-05-09 DIAGNOSIS — I10 Essential (primary) hypertension: Secondary | ICD-10-CM | POA: Insufficient documentation

## 2018-05-09 DIAGNOSIS — Z79899 Other long term (current) drug therapy: Secondary | ICD-10-CM | POA: Insufficient documentation

## 2018-05-09 DIAGNOSIS — W1842XA Slipping, tripping and stumbling without falling due to stepping into hole or opening, initial encounter: Secondary | ICD-10-CM | POA: Insufficient documentation

## 2018-05-09 MED ORDER — IBUPROFEN 200 MG PO TABS
600.0000 mg | ORAL_TABLET | Freq: Once | ORAL | Status: AC
  Administered 2018-05-09: 600 mg via ORAL
  Filled 2018-05-09: qty 3

## 2018-05-09 NOTE — ED Provider Notes (Signed)
[facility] DEPT Provider Note   CSN: [id number] Arrival date & time: 05/09/18  1201     History   Chief Complaint Chief Complaint  Patient presents with  . Joint Swelling    HPI Wendy Floyd is a 37 y.o. female with past medical history of hypertension, presenting to the emergency department with acute onset of worsening left ankle pain and swelling that began on Friday.  Patient states she stepped out of her car and into a ditch, rolling her ankle.  She has had associated pain and swelling to the lateral aspect of the ankle as well as some pain to the posterior and medial aspect that is worse with movement and ambulating.  Has not taken any medications for pain though has applied some ice intermittently.  No previous fractures to ankle though remote history of sprain.  Denies wounds or other injuries.  The history is provided by the patient.    Past Medical History:  Diagnosis Date  . Anemia   . Endometriosis   . Fibroids   . Hypertension     Patient Active Problem List   Diagnosis Date Noted  . Supervision of high-risk pregnancy of elderly multigravida (>= 44 years old at time of delivery), third trimester 04/30/2017  . Anemia affecting pregnancy in second trimester 01/05/2017  . Low serum potassium 01/05/2017  . Supervision of high risk pregnancy, antepartum 01/02/2017  . Late prenatal care affecting pregnancy in second trimester 01/02/2017  . Chronic hypertension during pregnancy, antepartum 01/02/2017  . Advanced maternal age in multigravida 01/02/2017  . Status post cesarean section 03/25/2015  . Smoker 09/24/2014    Past Surgical History:  Procedure Laterality Date  . CESAREAN SECTION     4 c/s prior   . CESAREAN SECTION N/A 03/25/2015   Procedure: CESAREAN SECTION;  Surgeon: Truett Mainland, DO;  Location: [facility];  Service: Obstetrics;  Laterality: N/A;  Requested 03/25/15 @ 11:00a  . CESAREAN SECTION N/A 04/30/2017   Procedure: REPEAT CESAREAN SECTION;  Surgeon: Sloan Leiter, MD;  Location: [facility];  Service: Obstetrics;  Laterality: N/A;  . laporoscopy     to remove endometriosis     OB History    Gravida  6   Para  6   Term  6   Preterm  0   AB  0   Living  6     SAB  0   TAB  0   Ectopic  0   Multiple  0   Live Births  6            Home Medications    Prior to Admission medications   Medication Sig Start Date End Date Taking? Authorizing Provider  albuterol (PROVENTIL HFA;VENTOLIN HFA) 108 (90 BASE) MCG/ACT inhaler Inhale 2 puffs into the lungs every 6 (six) hours as needed for wheezing or shortness of breath. Patient not taking: Reported on 04/25/2017 09/28/14   Luvenia Redden, PA-C  ibuprofen (ADVIL,MOTRIN) 600 MG tablet Take 1 tablet (600 mg total) by mouth every 6 (six) hours. 05/02/17   Degele, Jenne Pane, MD  medroxyPROGESTERone (DEPO-PROVERA) 150 MG/ML injection Inject 1 mL (150 mg total) into the muscle every 3 (three) months. 05/14/17   Shelly Bombard, MD  metroNIDAZOLE (FLAGYL) 500 MG tablet Take 1 tablet (500 mg total) by mouth 2 (two) times daily. 11/19/17   Shelly Bombard, MD  nitrofurantoin, macrocrystal-monohydrate, (MACROBID) 100 MG capsule Take 1 capsule (100 mg total)  by mouth 2 (two) times daily. 11/11/17   Shelly Bombard, MD  oxyCODONE (OXY IR/ROXICODONE) 5 MG immediate release tablet Take 1 tablet (5 mg total) by mouth every 6 (six) hours as needed for severe pain. 05/02/17   Degele, Jenne Pane, MD  Prenat-Fe Poly-Methfol-FA-DHA (VITAFOL ULTRA) 29-0.6-0.4-200 MG CAPS Take 1 tablet by mouth daily. 04/12/17   Gwen Pounds, CNM    Family History Family History  Problem Relation Age of Onset  . Diabetes Mother   . Hypertension Mother   . Diabetes Father   . Hearing loss Paternal Grandmother     Social History Social History   Tobacco Use  . Smoking status: Former Smoker    Packs/day: 0.50    Last attempt to quit: 10/28/2016    Years  since quitting: 1.5  . Smokeless tobacco: Never Used  Substance Use Topics  . Alcohol use: No  . Drug use: Yes    Types: Marijuana    Comment: march 2018     Allergies   Patient has no known allergies.   Review of Systems Review of Systems  Musculoskeletal: Positive for arthralgias and joint swelling.  Skin: Positive for color change. Negative for wound.  Neurological: Negative for numbness.     Physical Exam Updated Vital Signs BP 114/85   Pulse 69   Temp 98.1 F (36.7 C) (Oral)   Resp 17   LMP 04/18/2018   SpO2 98%   Physical Exam  Constitutional: She appears well-developed and well-nourished.  HENT:  Head: Normocephalic and atraumatic.  Eyes: Conjunctivae are normal.  Cardiovascular: Intact distal pulses.  Pulmonary/Chest: Effort normal.  Musculoskeletal:  Left ankle with ecchymosis and swelling, more so over the lateral aspect.  Ankle is stable on exam.  TTP to lateral malleolus, medial malleolus and just posterior to the medial malleolus.  Achilles is intact.  Pain with plantar flexion.  No wounds.  Normal sensation to toes.  Toes are warm and well perfused.  Psychiatric: She has a normal mood and affect. Her behavior is normal.  Nursing note and vitals reviewed.    ED Treatments / Results  Labs (all labs ordered are listed, but only abnormal results are displayed) Labs Reviewed - No data to display  EKG None  Radiology Dg Ankle Complete Left  Result Date: 05/09/2018 CLINICAL DATA:  Acute LEFT ankle pain following injury several days ago. Initial encounter. EXAM: LEFT ANKLE COMPLETE - 3+ VIEW COMPARISON:  10/15/2016 FINDINGS: No acute fracture, subluxation or dislocation. Ankle joint unremarkable. LATERAL soft tissue swelling is noted. No focal bony lesions are present. IMPRESSION: Soft tissue swelling without acute bony abnormality. Electronically Signed   By: Margarette Canada M.D.   On: 05/09/2018 13:04   Dg Foot Complete Left  Result Date:  05/09/2018 CLINICAL DATA:  Left ankle pain. EXAM: LEFT FOOT - COMPLETE 3+ VIEW COMPARISON:  None. FINDINGS: There is no evidence of fracture or dislocation. There is a bipartite medial hallux sesamoid. There is no evidence of arthropathy or other focal bone abnormality. Soft tissues are unremarkable. IMPRESSION: No acute osseous injury of the left foot. Electronically Signed   By: Kathreen Devoid   On: 05/09/2018 13:07    Procedures Procedures (including critical care time)  Medications Ordered in ED Medications  ibuprofen (ADVIL,MOTRIN) tablet 600 mg (600 mg Oral Given 05/09/18 1241)     Initial Impression / Assessment and Plan / ED Course  I have reviewed the triage vital signs and the nursing notes.  Pertinent  labs & imaging results that were available during my care of the patient were reviewed by me and considered in my medical decision making (see chart for details).     Patient with likely sprain to left ankle after rolling it on Friday.  Associated swelling and ecchymosis.  Patient able to range ankle though with pain.  Ankle is stable on exam.  Neurovascularly intact.  X-ray is negative for acute fracture.  Ibuprofen given for pain.  Ankle placed in ASO brace, crutches, and orthopedic referral provided for follow-up.  Conservative management, RICE therapy, NSAIDs.  Safe for discharge.  Discussed results, findings, treatment and follow up. Patient advised of return precautions. Patient verbalized understanding and agreed with plan.   Final Clinical Impressions(s) / ED Diagnoses   Final diagnoses:  Sprain of left ankle, unspecified ligament, initial encounter    ED Discharge Orders    None       Tai Skelly, [place] N, PA-C 05/09/18 1328    Tegeler, Gwenyth Allegra, MD 05/09/18 [phone]

## 2018-05-09 NOTE — ED Triage Notes (Signed)
Pt reports stepping out of her car over the past weekend and rolled her ankle.  Noticed swelling and tried to treat at home.  Swelling has gotten worse.

## 2018-05-09 NOTE — Discharge Instructions (Signed)
Please read instructions below. Apply ice to your ankle for 20 minutes at a time.  Elevate it as much as possible. Avoid weightbearing to allow your ankle to heal. You can take ibuprofen every 6 hours as needed for pain. Schedule an appointment with the orthopedic specialist in 1 to 2 weeks for follow-up on your injury if symptoms persist. Return to the ER for new or concerning symptoms.

## 2018-05-09 NOTE — ED Notes (Signed)
Patient verbalized understanding of discharge instructions, no questions. Patient out of ED via crutches with steady gait in no distress.
